# Patient Record
Sex: Male | Born: 1950 | Race: Black or African American | Hispanic: No | Smoking: Current every day smoker
Health system: Southern US, Community
[De-identification: ages and names within clinical notes are randomized; demographics above are authoritative.]

## PROBLEM LIST (undated history)

## (undated) DIAGNOSIS — M549 Dorsalgia, unspecified: Secondary | ICD-10-CM

## (undated) DIAGNOSIS — G8929 Other chronic pain: Secondary | ICD-10-CM

## (undated) HISTORY — PX: BACK SURGERY: SHX140

---

## 2005-11-29 ENCOUNTER — Emergency Department: Payer: Self-pay | Admitting: Emergency Medicine

## 2005-12-16 ENCOUNTER — Ambulatory Visit: Payer: Self-pay | Admitting: Specialist

## 2006-05-05 ENCOUNTER — Ambulatory Visit (HOSPITAL_COMMUNITY): Admission: RE | Admit: 2006-05-05 | Discharge: 2006-05-07 | Payer: Self-pay | Admitting: Neurosurgery

## 2006-09-22 ENCOUNTER — Ambulatory Visit: Payer: Self-pay | Admitting: Neurosurgery

## 2006-10-01 ENCOUNTER — Emergency Department: Payer: Self-pay | Admitting: Emergency Medicine

## 2009-08-26 ENCOUNTER — Ambulatory Visit: Payer: Self-pay | Admitting: Neurology

## 2013-08-15 ENCOUNTER — Emergency Department: Payer: Self-pay | Admitting: Emergency Medicine

## 2013-08-15 LAB — CBC
HCT: 48.6 % (ref 40.0–52.0)
HGB: 15.7 g/dL (ref 13.0–18.0)
MCH: 30.1 pg (ref 26.0–34.0)
MCHC: 32.4 g/dL (ref 32.0–36.0)
MCV: 93 fL (ref 80–100)
PLATELETS: 262 10*3/uL (ref 150–440)
RBC: 5.22 10*6/uL (ref 4.40–5.90)
RDW: 13.7 % (ref 11.5–14.5)
WBC: 11.4 10*3/uL — ABNORMAL HIGH (ref 3.8–10.6)

## 2013-08-15 LAB — DRUG SCREEN, URINE
AMPHETAMINES, UR SCREEN: NEGATIVE (ref ?–1000)
BARBITURATES, UR SCREEN: NEGATIVE (ref ?–200)
Benzodiazepine, Ur Scrn: NEGATIVE (ref ?–200)
COCAINE METABOLITE, UR ~~LOC~~: POSITIVE (ref ?–300)
Cannabinoid 50 Ng, Ur ~~LOC~~: NEGATIVE (ref ?–50)
MDMA (Ecstasy)Ur Screen: NEGATIVE (ref ?–500)
Methadone, Ur Screen: NEGATIVE (ref ?–300)
OPIATE, UR SCREEN: POSITIVE (ref ?–300)
Phencyclidine (PCP) Ur S: NEGATIVE (ref ?–25)
Tricyclic, Ur Screen: NEGATIVE (ref ?–1000)

## 2013-08-15 LAB — COMPREHENSIVE METABOLIC PANEL
AST: 21 U/L (ref 15–37)
Albumin: 3.6 g/dL (ref 3.4–5.0)
Alkaline Phosphatase: 85 U/L
Anion Gap: 8 (ref 7–16)
BUN: 9 mg/dL (ref 7–18)
Bilirubin,Total: 0.9 mg/dL (ref 0.2–1.0)
CALCIUM: 9.4 mg/dL (ref 8.5–10.1)
CREATININE: 0.94 mg/dL (ref 0.60–1.30)
Chloride: 106 mmol/L (ref 98–107)
Co2: 25 mmol/L (ref 21–32)
Glucose: 81 mg/dL (ref 65–99)
Osmolality: 275 (ref 275–301)
POTASSIUM: 3.6 mmol/L (ref 3.5–5.1)
SGPT (ALT): 17 U/L (ref 12–78)
Sodium: 139 mmol/L (ref 136–145)
TOTAL PROTEIN: 8.6 g/dL — AB (ref 6.4–8.2)

## 2013-08-15 LAB — URINALYSIS, COMPLETE
Bacteria: NONE SEEN
Blood: NEGATIVE
Glucose,UR: NEGATIVE mg/dL (ref 0–75)
KETONE: NEGATIVE
Nitrite: NEGATIVE
PH: 5 (ref 4.5–8.0)
Protein: 30
RBC,UR: 3 /HPF (ref 0–5)
SPECIFIC GRAVITY: 1.029 (ref 1.003–1.030)

## 2013-08-15 LAB — ETHANOL: Ethanol %: <0.003 % (ref 0.000–0.080)

## 2013-08-15 LAB — SALICYLATE LEVEL: SALICYLATES, SERUM: 3.7 mg/dL — AB

## 2013-08-15 LAB — ACETAMINOPHEN LEVEL: Acetaminophen: <2 ug/mL

## 2013-10-22 ENCOUNTER — Emergency Department: Payer: Self-pay | Admitting: Emergency Medicine

## 2013-10-22 LAB — BASIC METABOLIC PANEL
Anion Gap: 5 — ABNORMAL LOW (ref 7–16)
BUN: 9 mg/dL (ref 7–18)
CHLORIDE: 107 mmol/L (ref 98–107)
CREATININE: 1 mg/dL (ref 0.60–1.30)
Calcium, Total: 8.8 mg/dL (ref 8.5–10.1)
Co2: 27 mmol/L (ref 21–32)
EGFR (African American): >60
Glucose: 119 mg/dL — ABNORMAL HIGH (ref 65–99)
Osmolality: 277 (ref 275–301)
POTASSIUM: 3.4 mmol/L — AB (ref 3.5–5.1)
Sodium: 139 mmol/L (ref 136–145)

## 2013-10-22 LAB — CBC
HCT: 47.4 % (ref 40.0–52.0)
HGB: 15.4 g/dL (ref 13.0–18.0)
MCH: 30.2 pg (ref 26.0–34.0)
MCHC: 32.5 g/dL (ref 32.0–36.0)
MCV: 93 fL (ref 80–100)
PLATELETS: 268 10*3/uL (ref 150–440)
RBC: 5.11 10*6/uL (ref 4.40–5.90)
RDW: 13 % (ref 11.5–14.5)
WBC: 13.2 10*3/uL — AB (ref 3.8–10.6)

## 2013-10-22 LAB — D-DIMER(ARMC): D-Dimer: 551 ng/ml

## 2013-10-22 LAB — TROPONIN I

## 2014-08-04 IMAGING — CT CT ANGIO CHEST
2 of 6 series · 18 of 36 positions shown · IV contrast (APPLIED)
Comparison: None.

CLINICAL DATA: Left chest pain and scapular pain while eating a
cold hamburger. Smoker. Shortness of breath. Elevated D-dimer.

EXAM:
CT ANGIOGRAPHY CHEST WITH CONTRAST
TECHNIQUE: Multidetector CT imaging of the chest was performed using the
standard protocol during bolus administration of intravenous
contrast. Multiplanar CT image reconstructions and MIPs were
obtained to evaluate the vascular anatomy.
CONTRAST:  75 mL Isovue 370

[Series 5: pe 1.0 thins · axial · 0.68mm/px · z∈[-343,-91]mm · 17 of 284 slices shown]
[im 16/284  lung]
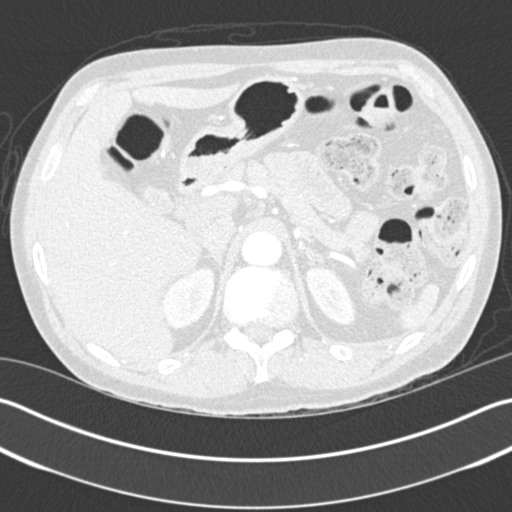
[im 32/284  mediastinal]
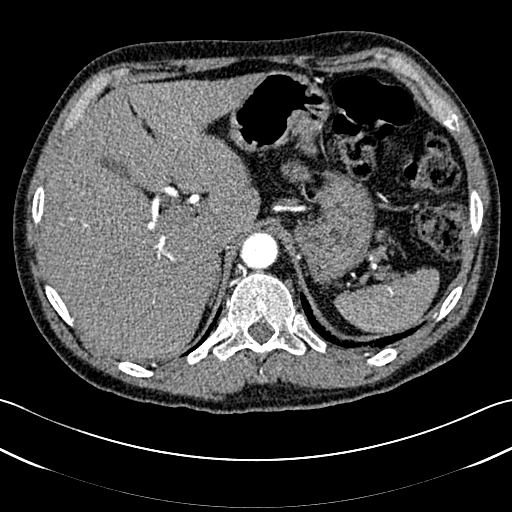
[im 48/284  lung]
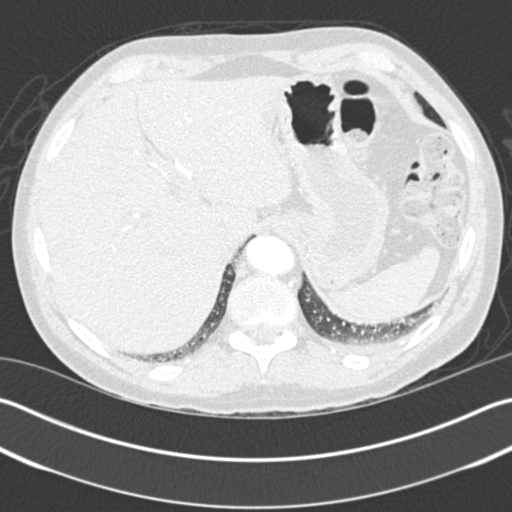
[im 63/284  mediastinal]
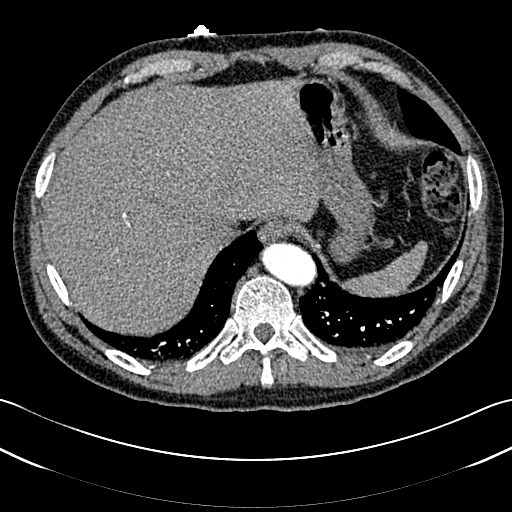
[im 79/284  lung]
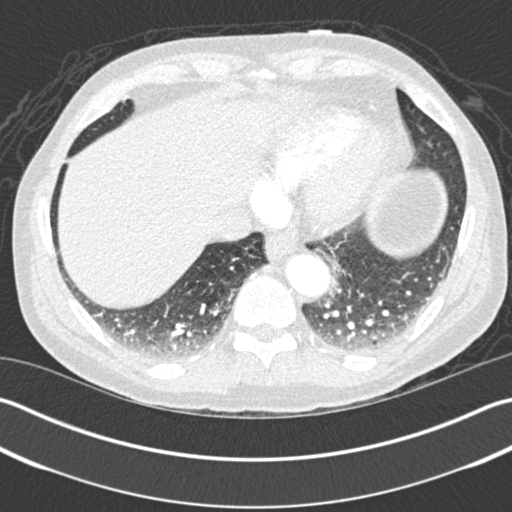
[im 95/284  mediastinal]
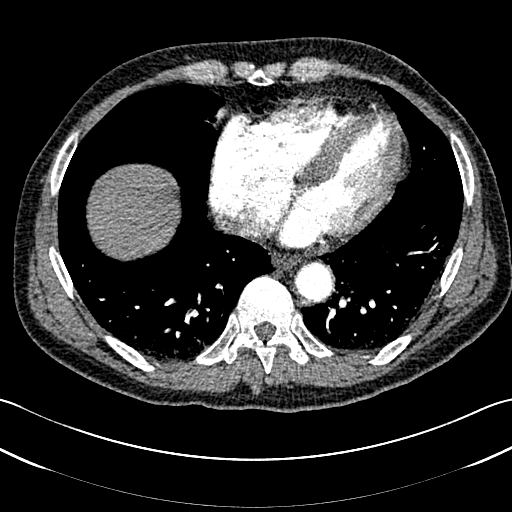
[im 111/284  lung]
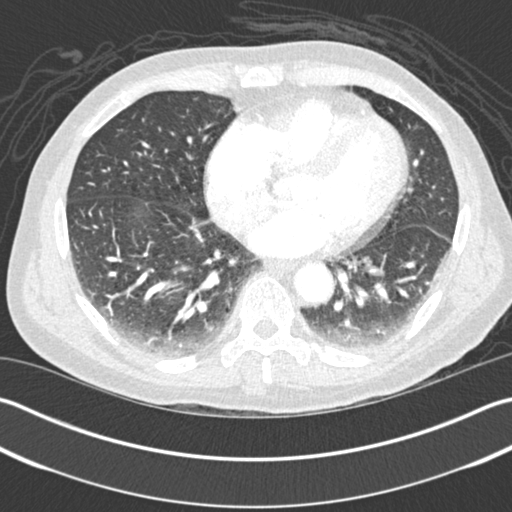
[im 126/284  mediastinal]
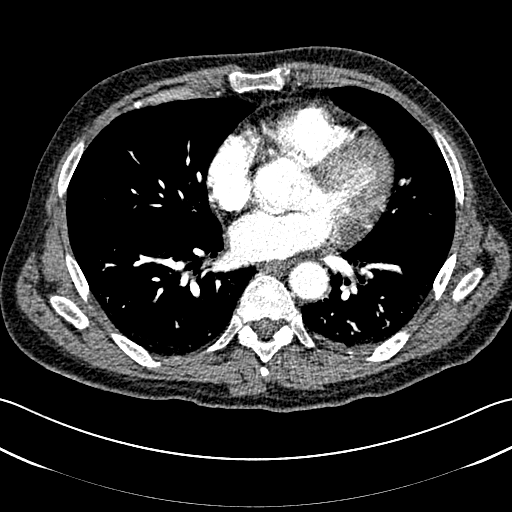
[im 142/284  lung]
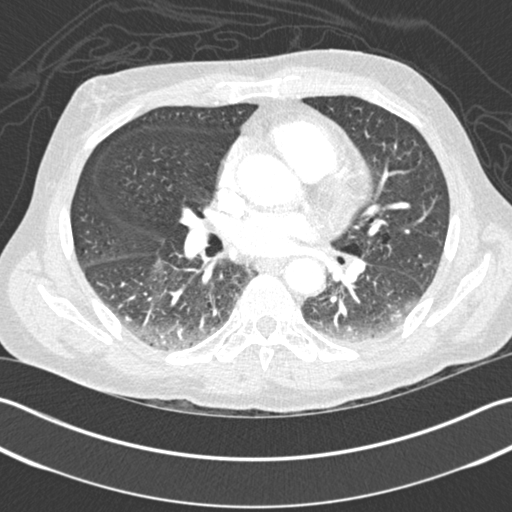
[im 158/284  mediastinal]
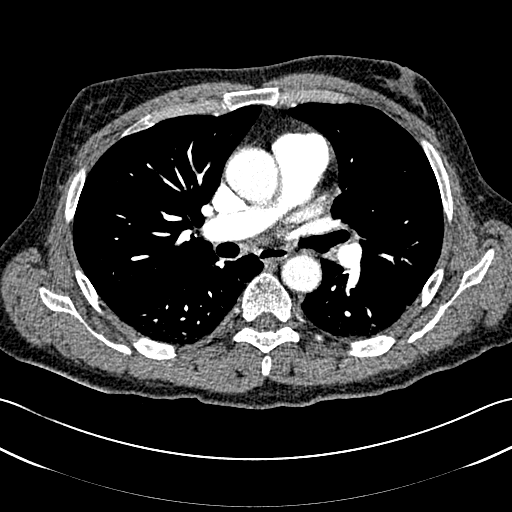
[im 173/284  lung]
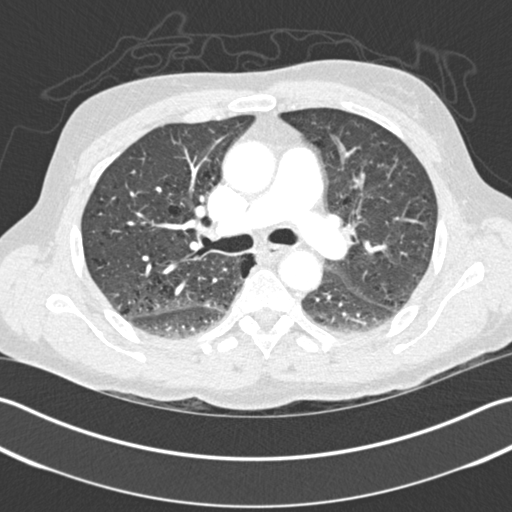
[im 189/284  mediastinal]
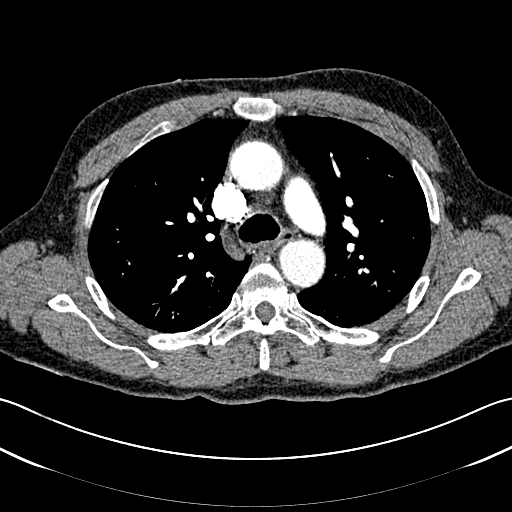
[im 205/284  lung]
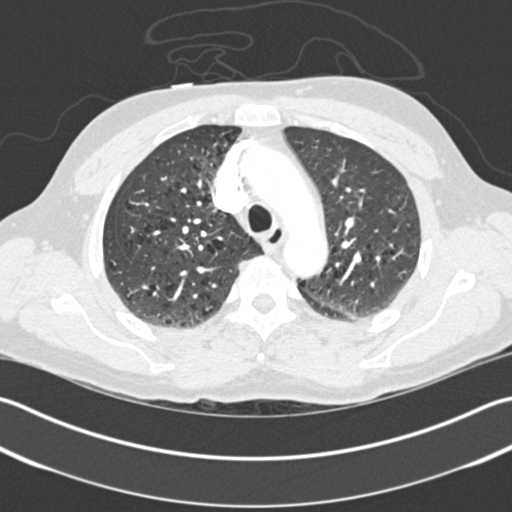
[im 221/284  mediastinal]
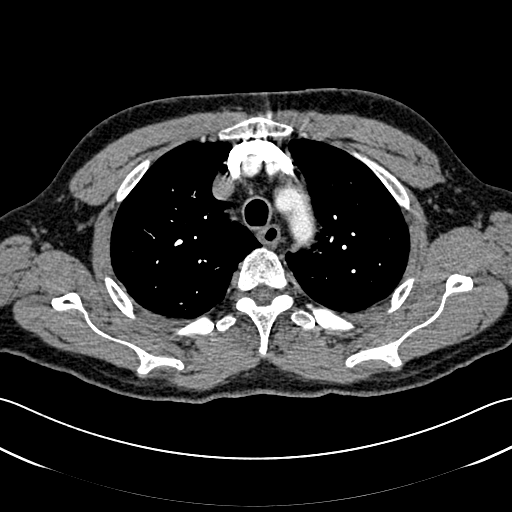
[im 236/284  lung]
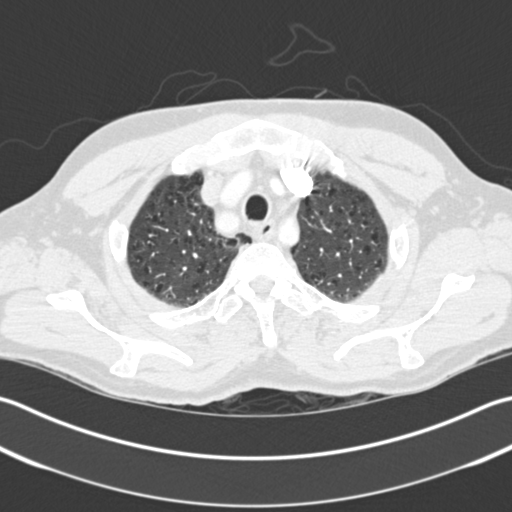
[im 252/284  mediastinal]
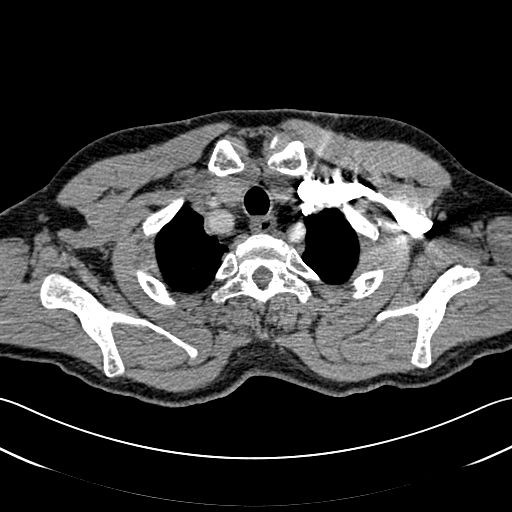
[im 268/284  lung]
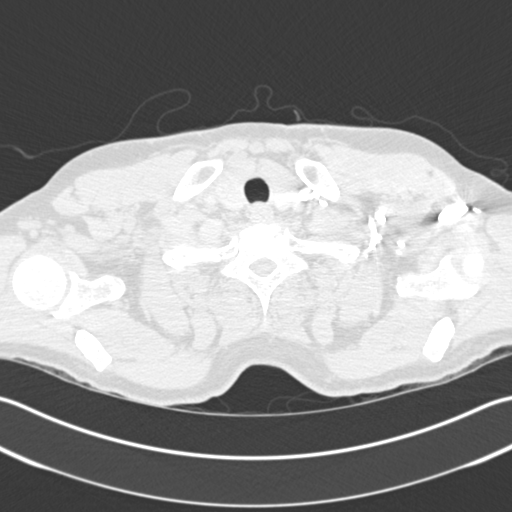

[Series 7: cor pe 2.0 mpr · coronal · 0.57mm/px · 1 of 120 slices shown]
[im 60/120  mediastinal]
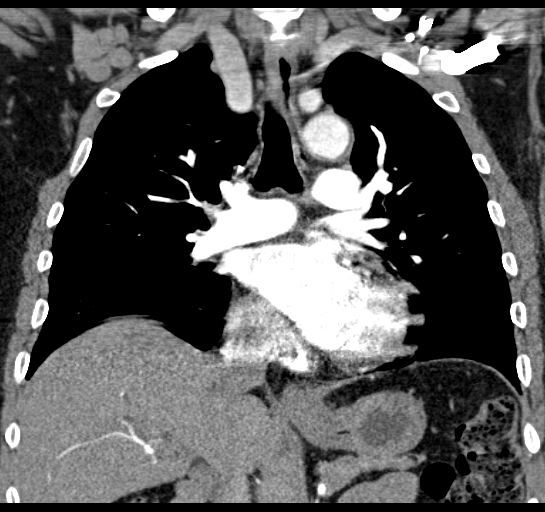

[18 of 36 positions shown; findings below may reference images not displayed]

FINDINGS: Technically adequate study with good opacification of the central
and segmental pulmonary arteries. No focal filling defects are
identified. No evidence of significant pulmonary embolus.

Normal heart size. No pericardial effusion. Coronary artery
calcifications. Normal caliber thoracic aorta without aneurysm. No
evidence of dissection. Great vessel origins are patent. Esophagus
is decompressed. No significant lymphadenopathy in the chest. No
pleural effusions. Visualized portions of the upper abdominal organs
are grossly unremarkable. Lungs demonstrate dependent changes in the
bases. Motion artifact limits evaluation of the lungs. No focal
airspace consolidation is identified. Diffuse centrilobular
emphysema throughout. No pneumothorax. Airways appear patent.
Minimal mucous demonstrated in the right mainstem bronchus. No
destructive bone lesions appreciated.

Review of the MIP images confirms the above findings.
IMPRESSION: No evidence of significant pulmonary embolus.

## 2014-08-04 IMAGING — CR DG CHEST 2V
1 series · 2 of 2 positions shown · non-contrast
Comparison: None.

CLINICAL DATA: Chest pain and weakness.

EXAM:
CHEST  2 VIEW

[Series 2: w chest pa · 0.14mm/px · 2 of 2 slices shown]
[im 1/2]
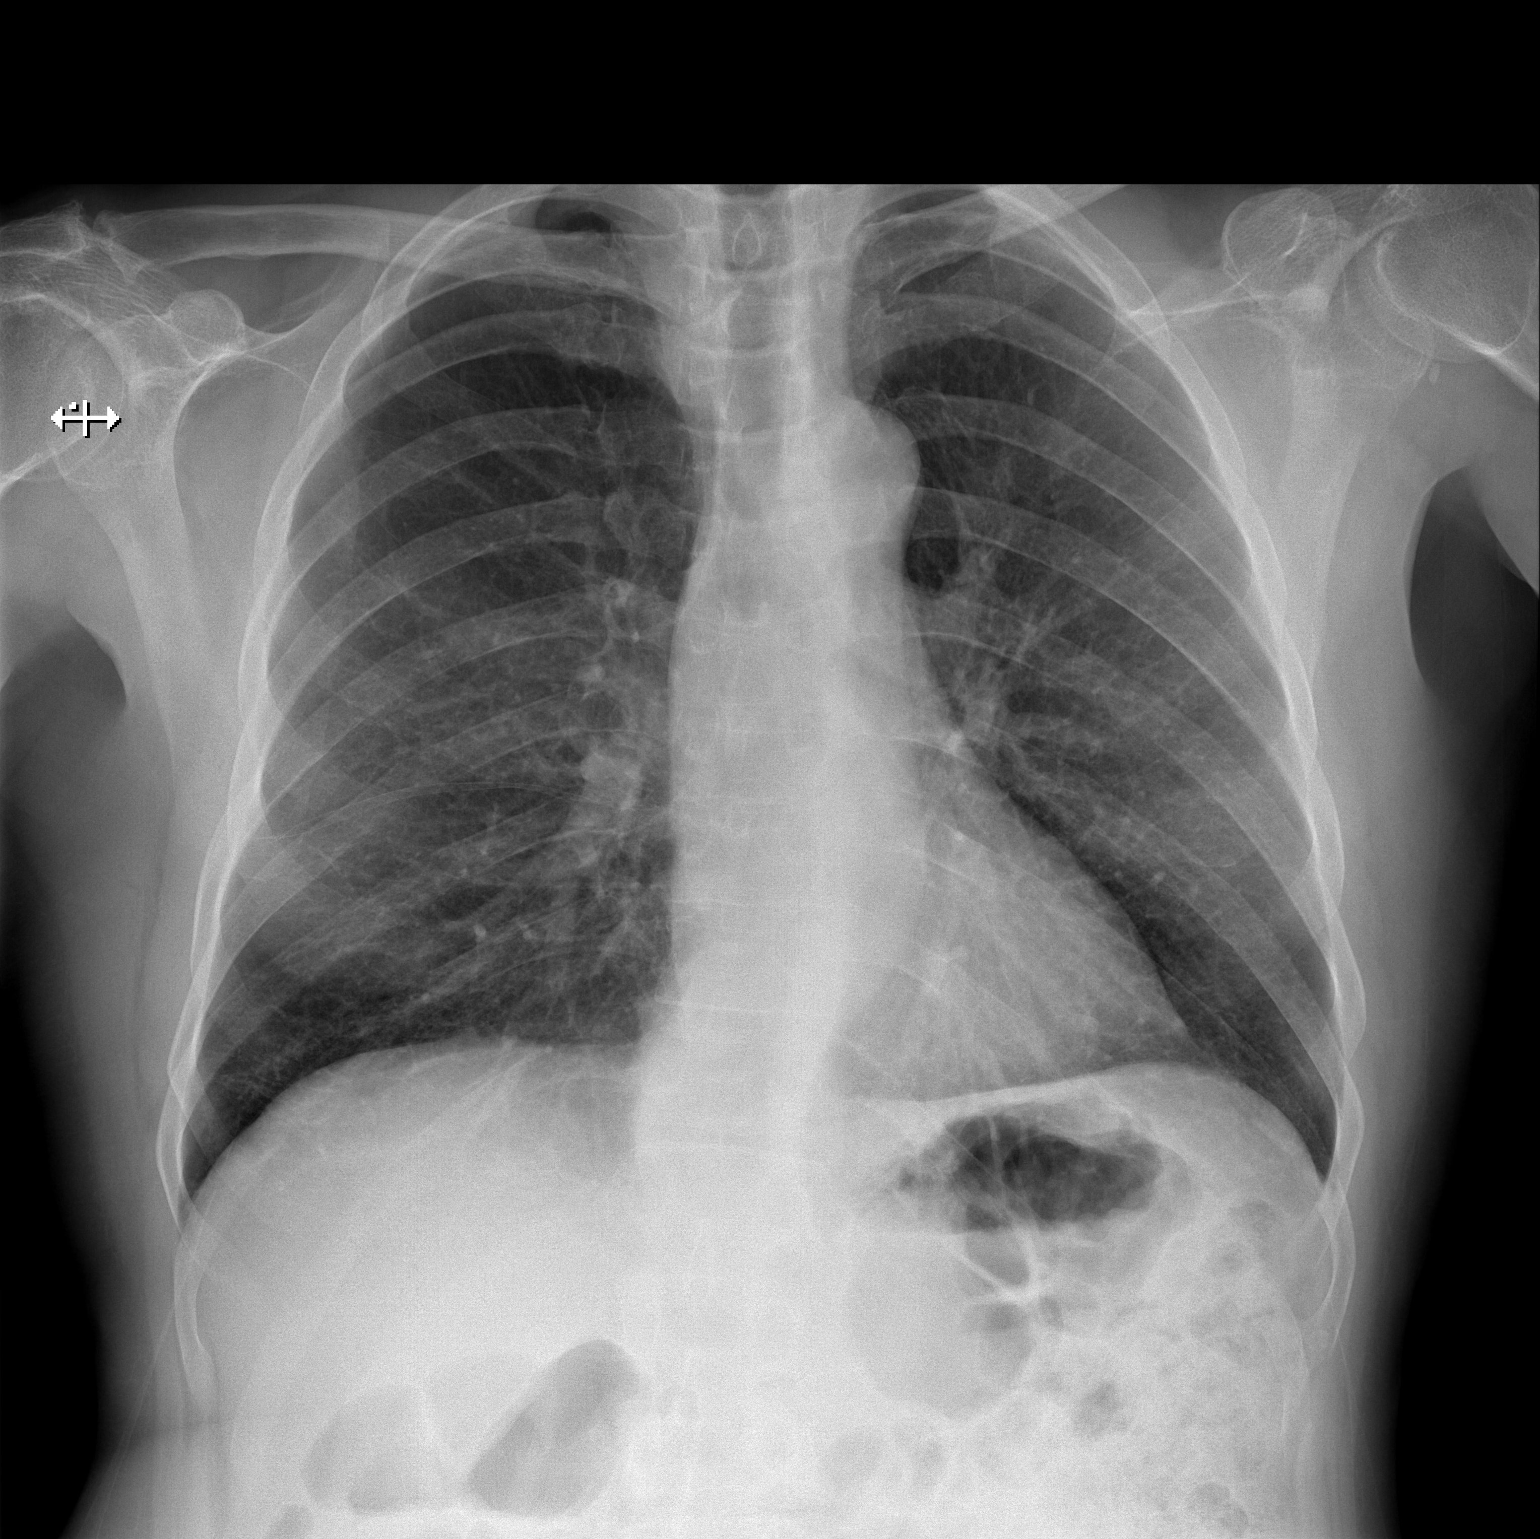
[im 2/2]
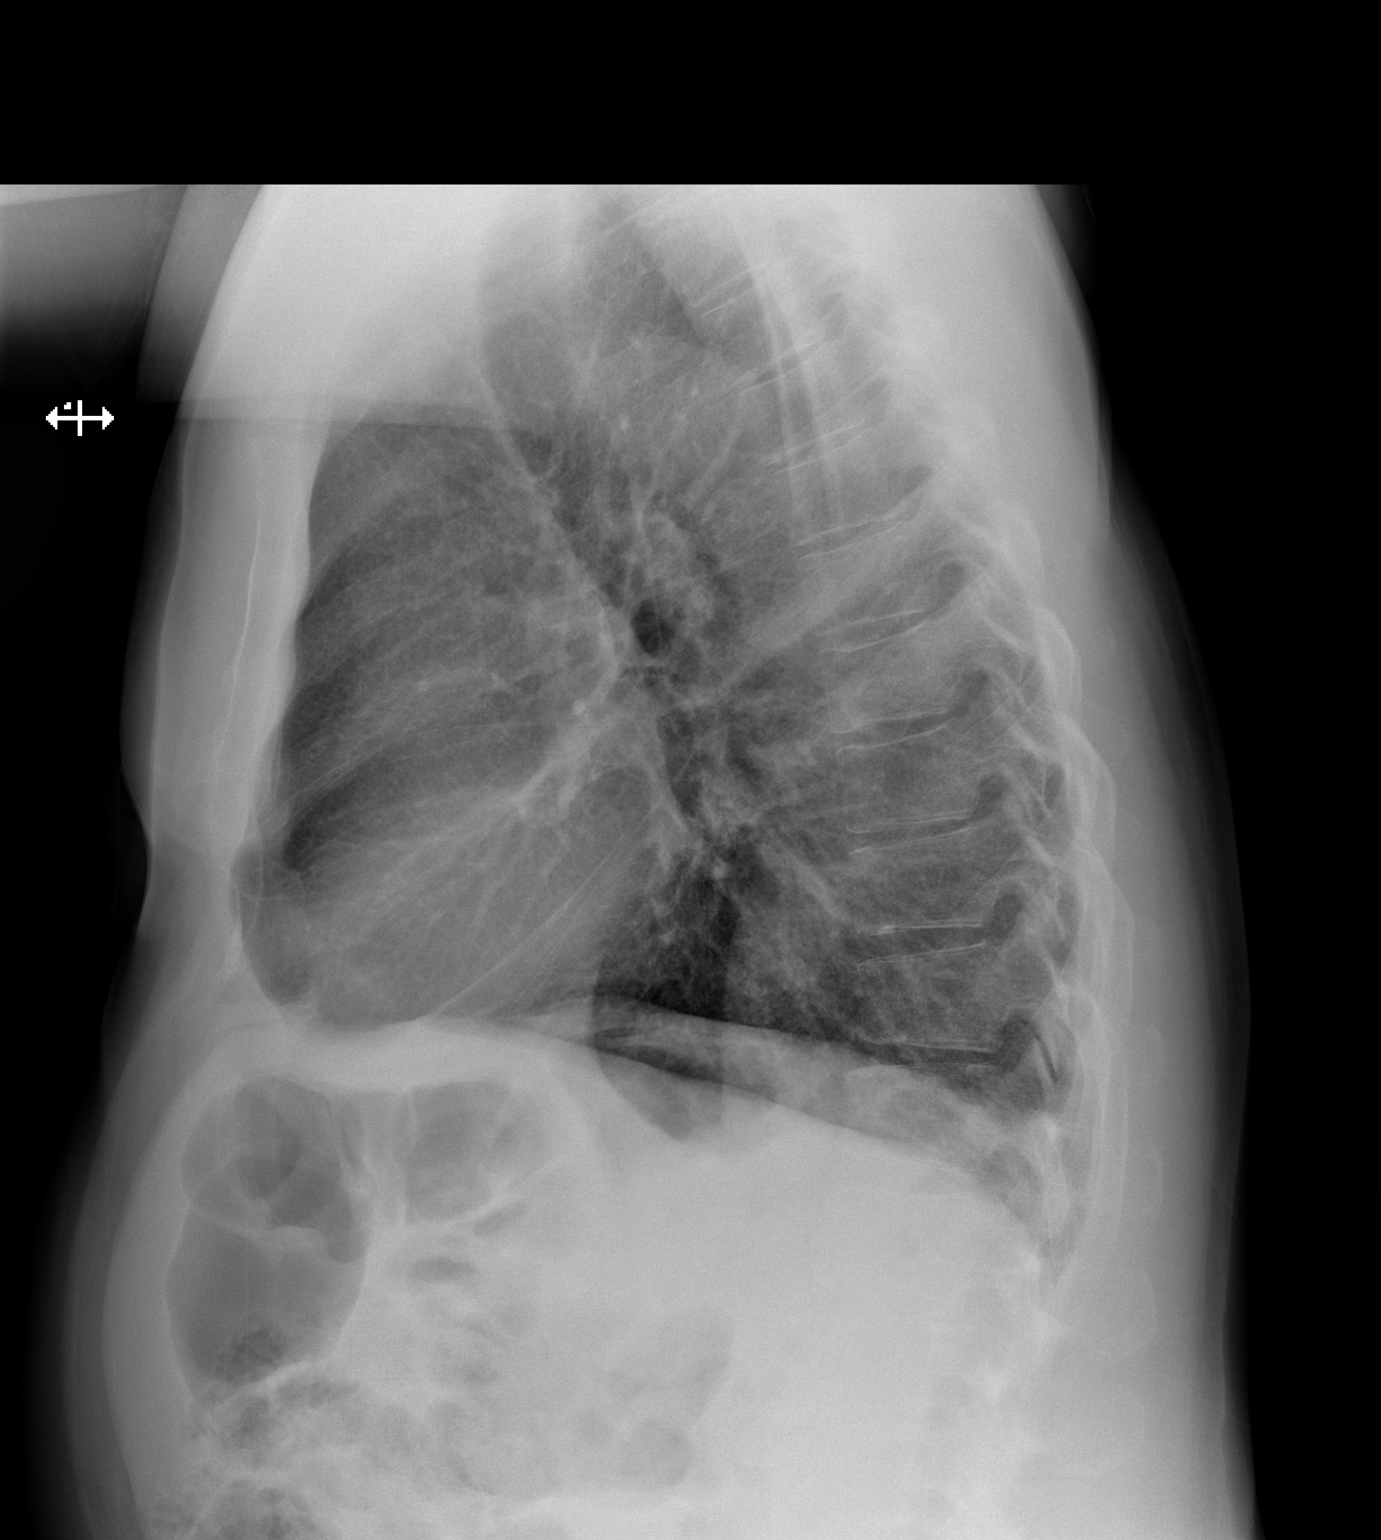

[2 of 2 positions shown; findings below may reference images not displayed]

FINDINGS: The cardiac silhouette, mediastinal and hilar contours are within
normal limits. There is mild tortuosity of the thoracic aorta. The
lungs are clear. No pleural effusion. The bony thorax is intact.
Slight anterior wedging of T11 and T12 of uncertain age.
IMPRESSION: No acute cardiopulmonary findings.

## 2014-11-15 ENCOUNTER — Emergency Department
Admission: EM | Admit: 2014-11-15 | Discharge: 2014-11-15 | Disposition: A | Discharge status: Home / Self Care | Payer: Medicare HMO | Attending: Emergency Medicine | Admitting: Emergency Medicine

## 2014-11-15 DIAGNOSIS — M549 Dorsalgia, unspecified: Secondary | ICD-10-CM | POA: Insufficient documentation

## 2014-11-15 DIAGNOSIS — Z720 Tobacco use: Secondary | ICD-10-CM | POA: Insufficient documentation

## 2014-11-15 DIAGNOSIS — G8929 Other chronic pain: Secondary | ICD-10-CM | POA: Insufficient documentation

## 2014-11-15 HISTORY — DX: Dorsalgia, unspecified: M54.9

## 2014-11-15 MED ORDER — KETOROLAC TROMETHAMINE 10 MG PO TABS: 10.0000 mg | ORAL_TABLET | Freq: Four times a day (QID) | ORAL | Status: DC | PRN

## 2014-11-15 MED ORDER — DIAZEPAM 5 MG PO TABS
5.0000 mg | ORAL_TABLET | Freq: Once | ORAL | Status: AC
  Administered 2014-11-15: 5 mg via ORAL
  Filled 2014-11-15: qty 1

## 2014-11-15 MED ORDER — DIAZEPAM 2 MG PO TABS: 2.0000 mg | ORAL_TABLET | Freq: Three times a day (TID) | ORAL | Status: DC | PRN

## 2014-11-15 MED ORDER — KETOROLAC TROMETHAMINE 60 MG/2ML IM SOLN
60.0000 mg | Freq: Once | INTRAMUSCULAR | Status: AC
  Administered 2014-11-15: 60 mg via INTRAMUSCULAR
  Filled 2014-11-15: qty 2

## 2014-11-15 NOTE — ED Notes (Signed)
Pt states he has hx of chronic back pain; pt ran out of fentanyl which he states he was taking at home. Back pain increasingly worse. Pt ambulatory with a cane.

## 2014-11-15 NOTE — Discharge Instructions (Signed)
Emergency care providers appreciate that many patients coming to us are in severe pain and we wish to address their pain in the safest, most responsible manner.  It is important to recognize, however, that the proper treatment of chronic pain differs from that of the pain of injuries and acute illnesses.  Our goal is to provider quality, safe, personalized care and we thank you for giving us the opportunity to serve you.  The use of narcotics and related agents for chronic pain syndromes may lead to additional physical and psychological problems.  Nearly as many people die from prescription narcotics each year as die from car crashes.  Additionally, this risk is increased if such prescriptions are obtained from a variety of sources.  Therefore, only your primary care physician or a pain management specialist is able to safely treat such syndromes with narcotic medications long-term.  Documentation revealing such prescriptions have been sought from multiple sources may prohibit us from providing a refill or different narcotic medication.  Your name may be checked first through the [place] Controlled Substances Reporting System.  This database is a record of controlled substance medication prescriptions that the patient has received.  This has been established by [employer] in an effort to eliminate the dangerous, and often life-threatening, practice of obtaining multiple prescriptions from different medical providers.  If you have a chronic pain syndrome (i.e. chronic headaches, recurrent back or neck pain, dental pain, abdominal or pelvic pain without a specific diagnosis, or neuropathic pain such as fibromyalgia) or recurrent visits for the same condition without an acute diagnosis, you may be treated with non-narcotics and other non-addictive medicines.  Allergic reactions or negative side effects that may be reported by a patient to such medications will not typically lead to the use of a  narcotic analgesic or other controlled substance as an alternative.  Patients managing chronic pain with a personal physician should have provisions in place for breakthrough pain.  If you are in crisis, you should call your physician.  If your physician directs you to the emergency department, please have the doctor call and speak to our attending physician concerning your care.  When patients come to the Emergency Department (ED) with acute medical conditions in which the ED physician feels it is appropriate to prescribe narcotic or sedating pain medication, the physician will prescribe these in very limited quantities.  The amount of these medications will last only until you can see your primary care physician in his/her office.  Any patient who returns to the ED seeking refills should expect only non-narcotic pain medications.  In the event an acute medical condition exists and the emergency physician feels it is necessary that the patient be given a narcotic or sedating medication, a responsible adult driver should be present in the room prior to the medication being given by the nurse.  Prescriptions for narcotic or sedating medications that have been lost, stolen, or expired will NOT be refilled in the ED.  Patients who have chronic pain may receive non-narcotic prescriptions until seen by their primary care physician.  It is every patient's personal responsibility to maintain active prescriptions with his or her primary care physician or specialist.  

## 2014-11-15 NOTE — ED Notes (Signed)
AAOx3. Skin warm and dry.  ambulting with steady gait.  Posture is forward, but shoulders are relaxed.  Moving all extremities equally and strong.  Patient states that he is feeling better and that the "edge is off" of the pain.

## 2014-11-15 NOTE — ED Provider Notes (Signed)
[facility] Emergency Department Provider Note ____________________________________________  Time seen: Approximately 4:03 PM  I have reviewed the triage vital signs and the nursing notes.   HISTORY  Chief Complaint Back Pain   HPI Jason Moss is a 64 y.o. male who presents to the emergency department for pain management. He has been out of Fentanyl for 2-3 months. He had been seeing "someone at [facility], but she left and my appointments got all messed up." He has not called to try and reschedule. He has not been taking anything for pain. He denies new injury. Pain is in the same location as previous. He denies loss of bowel or bladder control.   Past Medical History  Diagnosis Date  . Back pain     There are no active problems to display for this patient.   History reviewed. No pertinent past surgical history.  Current Outpatient Rx  Name  Route  Sig  Dispense  Refill  . diazepam (VALIUM) 2 MG tablet   Oral   Take 1 tablet (2 mg total) by mouth every 8 (eight) hours as needed for anxiety.   30 tablet   0   . ketorolac (TORADOL) 10 MG tablet   Oral   Take 1 tablet (10 mg total) by mouth every 6 (six) hours as needed.   20 tablet   0     Allergies Shellfish allergy  No family history on file.  Social History History  Substance Use Topics  . Smoking status: Current Every Day Smoker  . Smokeless tobacco: Not on file  . Alcohol Use: No    Review of Systems Constitutional: No recent illness. Eyes: No visual changes. ENT: No sore throat. Cardiovascular: Denies chest pain or palpitations. Respiratory: Denies shortness of breath. Gastrointestinal: No abdominal pain.  Genitourinary: Negative for dysuria. Musculoskeletal: Pain in lower back. Skin: Negative for rash. Neurological: Negative for headaches, focal weakness or numbness. 10-point ROS otherwise negative.  ____________________________________________   PHYSICAL EXAM:  VITAL  SIGNS: ED Triage Vitals  Enc Vitals Group     BP 11/15/14 1516 114/66 mmHg     Pulse Rate 11/15/14 1516 68     Resp 11/15/14 1516 18     Temp 11/15/14 1516 98.5 F (36.9 C)     Temp Source 11/15/14 1516 Oral     SpO2 11/15/14 1516 96 %     Weight 11/15/14 1516 160 lb (72.576 kg)     Height 11/15/14 1516 5\' 6"  (1.676 m)     Head Cir --      Peak Flow --      Pain Score 11/15/14 1516 9     Pain Loc --      Pain Edu? --      Excl. in GC? --     Constitutional: Alert and oriented. Well appearing and in no acute distress. Eyes: Conjunctivae are normal. EOMI. Head: Atraumatic. Nose: No congestion/rhinnorhea. Neck: No stridor.  Respiratory: Normal respiratory effort.   Musculoskeletal: Diffuse tenderness over the lumbar area without focal midline tenderness.  Neurologic:  Normal speech and language. No gross focal neurologic deficits are appreciated. Speech is normal. No gait instability. Skin:  Skin is warm, dry and intact. Atraumatic. Psychiatric: Mood and affect are normal. Speech and behavior are normal.  ____________________________________________   LABS (all labs ordered are listed, but only abnormal results are displayed)  Labs Reviewed - No data to display ____________________________________________  RADIOLOGY  Not indicated. ____________________________________________   PROCEDURES  Procedure(s)  performed: None   ____________________________________________   INITIAL IMPRESSION / ASSESSMENT AND PLAN / ED COURSE  Pertinent labs & imaging results that were available during my care of the patient were reviewed by me and considered in my medical decision making (see chart for details).  IM Toradol and po valium given.   Patient to follow up to call [facility] to reschedule appointment. He was advised to return to the ER for numbness, tingling, loss of bowel or bladder control, or other new symptoms.  He was advised that the ER does not manage chronic  pain. ____________________________________________   FINAL CLINICAL IMPRESSION(S) / ED DIAGNOSES  Final diagnoses:  Chronic back pain greater than 3 months duration      Chinita Pester, FNP 11/15/14 1807  Minna Antis, MD 11/16/14 1245

## 2014-12-04 ENCOUNTER — Encounter: Payer: Self-pay | Admitting: Emergency Medicine

## 2014-12-04 ENCOUNTER — Emergency Department
Admission: EM | Admit: 2014-12-04 | Discharge: 2014-12-04 | Disposition: A | Discharge status: Home / Self Care | Payer: Medicare HMO | Attending: Emergency Medicine | Admitting: Emergency Medicine

## 2014-12-04 DIAGNOSIS — M545 Low back pain, unspecified: Secondary | ICD-10-CM

## 2014-12-04 DIAGNOSIS — Z720 Tobacco use: Secondary | ICD-10-CM | POA: Insufficient documentation

## 2014-12-04 MED ORDER — ETODOLAC ER 400 MG PO TB24: 400.0000 mg | ORAL_TABLET | Freq: Every day | ORAL | Status: DC

## 2014-12-04 MED ORDER — CYCLOBENZAPRINE HCL 10 MG PO TABS: 10.0000 mg | ORAL_TABLET | Freq: Three times a day (TID) | ORAL | Status: DC | PRN

## 2014-12-04 MED ORDER — KETOROLAC TROMETHAMINE 60 MG/2ML IM SOLN
60.0000 mg | Freq: Once | INTRAMUSCULAR | Status: AC
  Administered 2014-12-04: 60 mg via INTRAMUSCULAR
  Filled 2014-12-04: qty 2

## 2014-12-04 NOTE — ED Notes (Signed)
Back pain

## 2014-12-04 NOTE — ED Notes (Signed)
Low back pain.  Says he has had for long time, but it is worsel

## 2014-12-04 NOTE — Discharge Instructions (Signed)
Musculoskeletal Pain Musculoskeletal pain is muscle and boney aches and pains. These pains can occur in any part of the body. Your caregiver may treat you without knowing the cause of the pain. They may treat you if blood or urine tests, X-rays, and other tests were normal.  CAUSES There is often not a definite cause or reason for these pains. These pains may be caused by a type of germ (virus). The discomfort may also come from overuse. Overuse includes working out too hard when your body is not fit. Boney aches also come from weather changes. Bone is sensitive to atmospheric pressure changes. HOME CARE INSTRUCTIONS   Ask when your test results will be ready. Make sure you get your test results.  Only take over-the-counter or prescription medicines for pain, discomfort, or fever as directed by your caregiver. If you were given medications for your condition, do not drive, operate machinery or power tools, or sign legal documents for 24 hours. Do not drink alcohol. Do not take sleeping pills or other medications that may interfere with treatment.  Continue all activities unless the activities cause more pain. When the pain lessens, slowly resume normal activities. Gradually increase the intensity and duration of the activities or exercise.  During periods of severe pain, bed rest may be helpful. Lay or sit in any position that is comfortable.  Putting ice on the injured area.  Put ice in a bag.  Place a towel between your skin and the bag.  Leave the ice on for 15 to 20 minutes, 3 to 4 times a day.  Follow up with your caregiver for continued problems and no reason can be found for the pain. If the pain becomes worse or does not go away, it may be necessary to repeat tests or do additional testing. Your caregiver may need to look further for a possible cause. SEEK IMMEDIATE MEDICAL CARE IF:  You have pain that is getting worse and is not relieved by medications.  You develop chest pain  that is associated with shortness or breath, sweating, feeling sick to your stomach (nauseous), or throw up (vomit).  Your pain becomes localized to the abdomen.  You develop any new symptoms that seem different or that concern you. MAKE SURE YOU:   Understand these instructions.  Will watch your condition.  Will get help right away if you are not doing well or get worse. Document Released: 04/06/2005 Document Revised: 06/29/2011 Document Reviewed: 12/09/2012 [facility] Patient Information 2015 [place], [place]. This information is not intended to replace advice given to you by your health care provider. Make sure you discuss any questions you have with your health care provider.  Back Pain, Adult Back pain is very common. The pain often gets better over time. The cause of back pain is usually not dangerous. Most people can learn to manage their back pain on their own.  HOME CARE   Stay active. Start with short walks on flat ground if you can. Try to walk farther each day.  Do not sit, drive, or stand in one place for more than 30 minutes. Do not stay in bed.  Do not avoid exercise or work. Activity can help your back heal faster.  Be careful when you bend or lift an object. Bend at your knees, keep the object close to you, and do not twist.  Sleep on a firm mattress. Lie on your side, and bend your knees. If you lie on your back, put a pillow under your  knees.  Only take medicines as told by your doctor.  Put ice on the injured area.  Put ice in a plastic bag.  Place a towel between your skin and the bag.  Leave the ice on for 15-20 minutes, 03-04 times a day for the first 2 to 3 days. After that, you can switch between ice and heat packs.  Ask your doctor about back exercises or massage.  Avoid feeling anxious or stressed. Find good ways to deal with stress, such as exercise. GET HELP RIGHT AWAY IF:   Your pain does not go away with rest or medicine.  Your pain does not  go away in 1 week.  You have new problems.  You do not feel well.  The pain spreads into your legs.  You cannot control when you poop (bowel movement) or pee (urinate).  Your arms or legs feel weak or lose feeling (numbness).  You feel sick to your stomach (nauseous) or throw up (vomit).  You have belly (abdominal) pain.  You feel like you may pass out (faint). MAKE SURE YOU:   Understand these instructions.  Will watch your condition.  Will get help right away if you are not doing well or get worse. Document Released: 09/23/2007 Document Revised: 06/29/2011 Document Reviewed: 08/08/2013 [facility] Patient Information 2015 [place], [place]. This information is not intended to replace advice given to you by your health care provider. Make sure you discuss any questions you have with your health care provider.

## 2014-12-04 NOTE — ED Provider Notes (Signed)
[facility] Emergency Department Provider Note  ____________________________________________  Time seen: Approximately 9:41 AM  I have reviewed the triage vital signs and the nursing notes.   HISTORY  Chief Complaint Back Pain    HPI Jason Moss is a 64 y.o. male Jason Moss is a 64 y.o. male who presents to the emergency department for pain management. He has been out of Fentanyl for 2-3 months. He had been seeing "someone at [facility], but she left and my appointments got all messed up." He has not called to try and reschedule. He has not been taking anything for pain. He denies new injury. Pain is in the same location as previous. He denies loss of bowel or bladder control.   Past Medical History  Diagnosis Date  . Back pain     There are no active problems to display for this patient.   Past Surgical History  Procedure Laterality Date  . Back surgery      Current Outpatient Rx  Name  Route  Sig  Dispense  Refill  . cyclobenzaprine (FLEXERIL) 10 MG tablet   Oral   Take 1 tablet (10 mg total) by mouth every 8 (eight) hours as needed for muscle spasms.   30 tablet   1   . diazepam (VALIUM) 2 MG tablet   Oral   Take 1 tablet (2 mg total) by mouth every 8 (eight) hours as needed for anxiety.   30 tablet   0   . etodolac (LODINE XL) 400 MG 24 hr tablet   Oral   Take 1 tablet (400 mg total) by mouth daily.   90 tablet   0   . ketorolac (TORADOL) 10 MG tablet   Oral   Take 1 tablet (10 mg total) by mouth every 6 (six) hours as needed.   20 tablet   0     Allergies Shellfish allergy  No family history on file.  Social History Social History  Substance Use Topics  . Smoking status: Current Every Day Smoker  . Smokeless tobacco: None  . Alcohol Use: No    Review of Systems Constitutional: No fever/chills Eyes: No visual changes. ENT: No sore throat. Cardiovascular: Denies chest pain. Respiratory: Denies shortness of  breath. Gastrointestinal: No abdominal pain.  No nausea, no vomiting.  No diarrhea.  No constipation. Genitourinary: Negative for dysuria. Musculoskeletal: Negative for back pain. Skin: Negative for rash. Neurological: Negative for headaches, focal weakness or numbness.  10-point ROS otherwise negative.  ____________________________________________   PHYSICAL EXAM:  VITAL SIGNS: ED Triage Vitals  Enc Vitals Group     BP 12/04/14 0829 148/84 mmHg     Pulse Rate 12/04/14 0829 68     Resp 12/04/14 0829 16     Temp 12/04/14 0829 98.3 F (36.8 C)     Temp Source 12/04/14 0829 Oral     SpO2 12/04/14 0829 95 %     Weight 12/04/14 0829 155 lb (70.308 kg)     Height 12/04/14 0829  (1.702 m)     Head Cir --      Peak Flow --      Pain Score 12/04/14 0843 9     Pain Loc --      Pain Edu? --      Excl. in GC? --     Constitutional: Alert and oriented. Well appearing and in no acute distress. Eyes: Conjunctivae are normal. PERRL. EOMI. Head: Atraumatic. Nose: No congestion/rhinnorhea. Mouth/Throat: Mucous  membranes are moist.  Oropharynx non-erythematous. Neck: No stridor.   Cardiovascular: Normal rate, regular rhythm. Grossly normal heart sounds.  Good peripheral circulation. Respiratory: Normal respiratory effort.  No retractions. Lungs CTAB. Musculoskeletal: No lower extremity tenderness nor edema.  No joint effusions. Neurologic:  Normal speech and language. No gross focal neurologic deficits are appreciated. No gait instability. Skin:  Skin is warm, dry and intact. No rash noted. Psychiatric: Mood and affect are normal. Speech and behavior are normal.  ____________________________________________   LABS (all labs ordered are listed, but only abnormal results are displayed)  Labs Reviewed - No data to display   PROCEDURES  Procedure(s) performed: None  Critical Care performed: No  ____________________________________________   INITIAL IMPRESSION /  ASSESSMENT AND PLAN / ED COURSE  Pertinent labs & imaging results that were available during my care of the patient were reviewed by me and considered in my medical decision making (see chart for details).  Patient states approval with Toradol injection will discuss discharge with Rx for Lodine 400 mg 3 times a day, Flexeril 10 mg 3 times a day and follow up with PCP or pain management as directed. ____________________________________________   FINAL CLINICAL IMPRESSION(S) / ED DIAGNOSES  Final diagnoses:  Back pain at L4-L5 level      Evangeline Dakin, PA-C 12/04/14 1142  Sharyn Creamer, MD 12/05/14 2235

## 2015-01-01 ENCOUNTER — Emergency Department
Admission: EM | Admit: 2015-01-01 | Discharge: 2015-01-01 | Disposition: A | Discharge status: Home / Self Care | Payer: Medicare HMO | Attending: Emergency Medicine | Admitting: Emergency Medicine

## 2015-01-01 ENCOUNTER — Encounter: Payer: Self-pay | Admitting: Emergency Medicine

## 2015-01-01 DIAGNOSIS — M545 Low back pain: Secondary | ICD-10-CM | POA: Diagnosis not present

## 2015-01-01 DIAGNOSIS — Z720 Tobacco use: Secondary | ICD-10-CM | POA: Insufficient documentation

## 2015-01-01 DIAGNOSIS — M5416 Radiculopathy, lumbar region: Secondary | ICD-10-CM

## 2015-01-01 DIAGNOSIS — Z79899 Other long term (current) drug therapy: Secondary | ICD-10-CM | POA: Insufficient documentation

## 2015-01-01 DIAGNOSIS — M549 Dorsalgia, unspecified: Secondary | ICD-10-CM

## 2015-01-01 DIAGNOSIS — G8929 Other chronic pain: Secondary | ICD-10-CM | POA: Diagnosis not present

## 2015-01-01 MED ORDER — ORPHENADRINE CITRATE ER 100 MG PO TB12: 100.0000 mg | ORAL_TABLET | Freq: Two times a day (BID) | ORAL | Status: DC

## 2015-01-01 MED ORDER — DEXAMETHASONE SODIUM PHOSPHATE 10 MG/ML IJ SOLN
10.0000 mg | Freq: Once | INTRAMUSCULAR | Status: AC
  Administered 2015-01-01: 10 mg via INTRAMUSCULAR
  Filled 2015-01-01: qty 1

## 2015-01-01 MED ORDER — METHYLPREDNISOLONE 4 MG PO TBPK: ORAL_TABLET | ORAL | Status: DC

## 2015-01-01 NOTE — ED Provider Notes (Signed)
[facility] Emergency Department Provider Note  ____________________________________________  Time seen: Approximately 7:21 AM  I have reviewed the triage vital signs and the nursing notes.   HISTORY  Chief Complaint Back Pain    HPI Jason Moss is a 64 y.o. male is complaining of radicular pain from his back to his left leg. Patient state onset 2-3 days ago. Patient denies any bladder or bowel dysfunction. Patient has a long-term history of chronic low back pain. Patient sees no longer followed by his pain management clinic secondary to doctors relocated. Patient rated his pain as a 10 over 10.Patient was seen approximately a month ago similar complaint. Patient has not follow-up as directed with pain management. No palliative measures for this complaint.   Past Medical History  Diagnosis Date  . Back pain     There are no active problems to display for this patient.   Past Surgical History  Procedure Laterality Date  . Back surgery      Current Outpatient Rx  Name  Route  Sig  Dispense  Refill  . cyclobenzaprine (FLEXERIL) 10 MG tablet   Oral   Take 1 tablet (10 mg total) by mouth every 8 (eight) hours as needed for muscle spasms.   30 tablet   1   . diazepam (VALIUM) 2 MG tablet   Oral   Take 1 tablet (2 mg total) by mouth every 8 (eight) hours as needed for anxiety.   30 tablet   0   . etodolac (LODINE XL) 400 MG 24 hr tablet   Oral   Take 1 tablet (400 mg total) by mouth daily.   90 tablet   0   . ketorolac (TORADOL) 10 MG tablet   Oral   Take 1 tablet (10 mg total) by mouth every 6 (six) hours as needed.   20 tablet   0     Allergies Shellfish allergy  No family history on file.  Social History Social History  Substance Use Topics  . Smoking status: Current Every Day Smoker  . Smokeless tobacco: None  . Alcohol Use: No    Review of Systems Constitutional: No fever/chills Eyes: No visual changes. ENT: No sore  throat. Cardiovascular: Denies chest pain. Respiratory: Denies shortness of breath. Gastrointestinal: No abdominal pain.  No nausea, no vomiting.  No diarrhea.  No constipation. Genitourinary: Negative for dysuria. Musculoskeletal chronic low back pain. Skin: Negative for rash. Neurological: Negative for headaches, focal weakness or numbness. 10-point ROS otherwise negative.  ____________________________________________   PHYSICAL EXAM:  VITAL SIGNS: ED Triage Vitals  Enc Vitals Group     BP 01/01/15 0320 126/78 mmHg     Pulse Rate 01/01/15 0320 85     Resp 01/01/15 0320 22     Temp 01/01/15 0320 97.9 F (36.6 C)     Temp Source 01/01/15 0320 Oral     SpO2 01/01/15 0320 97 %     Weight 01/01/15 0320 155 lb (70.308 kg)     Height 01/01/15 0320 5\' 6"  (1.676 m)     Head Cir --      Peak Flow --      Pain Score 01/01/15 0320 10     Pain Loc --      Pain Edu? --      Excl. in GC? --     Constitutional: Alert and oriented. Well appearing and in no acute distress. Eyes: Conjunctivae are normal. PERRL. EOMI. Head: Atraumatic. Nose: No congestion/rhinnorhea. Mouth/Throat:  Mucous membranes are moist.  Oropharynx non-erythematous. Neck: No stridor.   Cardiovascular: Normal rate, regular rhythm. Grossly normal heart sounds.  Good peripheral circulation. Respiratory: Normal respiratory effort.  No retractions. Lungs CTAB. Gastrointestinal: Soft and nontender. No distention. No abdominal bruits. No CVA tenderness. Musculoskeletal: No obvious deformity to the lumbar spine. Patient has some moderate guarding palpation of 3 through L5. Patient decreased range of motion. She stated limited by pain patient is a negative straight leg raise. Patient was able to ambulate with support a cane.  Neurologic:  Normal speech and language. No gross focal neurologic deficits are appreciated. No gait instability. Skin:  Skin is warm, dry and intact. No rash noted. Psychiatric: Mood and affect are  normal. Speech and behavior are normal.  ____________________________________________   LABS (all labs ordered are listed, but only abnormal results are displayed)  Labs Reviewed - No data to display ____________________________________________  EKG   ____________________________________________  RADIOLOGY   ____________________________________________   PROCEDURES  Procedure(s) performed: None  Critical Care performed: No  ____________________________________________   INITIAL IMPRESSION / ASSESSMENT AND PLAN / ED COURSE  Pertinent labs & imaging results that were available during my care of the patient were reviewed by me and considered in my medical decision making (see chart for details).  Chronic low back pain. Radicular pain to the left lower extremity. Patient will be given a injection of Decadron 10 mg IM and Norflex 60 mg IM. Patient will be discharged with Flexeril and prednisone. Patient advised to follow-up with pain management. ____________________________________________   FINAL CLINICAL IMPRESSION(S) / ED DIAGNOSES  Final diagnoses:  Chronic back pain greater than 3 months duration  Chronic radicular pain of lower back      Joni Reining, PA-C 01/01/15 1610  Arnaldo Natal, MD 01/01/15 [phone]

## 2015-01-01 NOTE — ED Notes (Signed)
Pt to triage via w/c with no distress; c/o lower back pain radiating down left leg "for long time"; st injured back "opening a door"

## 2015-04-17 ENCOUNTER — Encounter: Payer: Self-pay | Admitting: *Deleted

## 2015-04-17 ENCOUNTER — Emergency Department
Admission: EM | Admit: 2015-04-17 | Discharge: 2015-04-17 | Disposition: A | Discharge status: Home / Self Care | Payer: Medicare HMO | Attending: Emergency Medicine | Admitting: Emergency Medicine

## 2015-04-17 DIAGNOSIS — F17200 Nicotine dependence, unspecified, uncomplicated: Secondary | ICD-10-CM | POA: Insufficient documentation

## 2015-04-17 DIAGNOSIS — M5442 Lumbago with sciatica, left side: Secondary | ICD-10-CM | POA: Diagnosis not present

## 2015-04-17 DIAGNOSIS — Z79899 Other long term (current) drug therapy: Secondary | ICD-10-CM | POA: Diagnosis not present

## 2015-04-17 DIAGNOSIS — G8929 Other chronic pain: Secondary | ICD-10-CM | POA: Insufficient documentation

## 2015-04-17 DIAGNOSIS — M545 Low back pain: Secondary | ICD-10-CM | POA: Diagnosis present

## 2015-04-17 MED ORDER — KETOROLAC TROMETHAMINE 30 MG/ML IJ SOLN
60.0000 mg | Freq: Once | INTRAMUSCULAR | Status: AC
  Administered 2015-04-17: 60 mg via INTRAMUSCULAR
  Filled 2015-04-17: qty 2

## 2015-04-17 MED ORDER — MELOXICAM 15 MG PO TABS: 15.0000 mg | ORAL_TABLET | Freq: Every day | ORAL | Status: DC

## 2015-04-17 MED ORDER — DIAZEPAM 2 MG PO TABS: 2.0000 mg | ORAL_TABLET | Freq: Three times a day (TID) | ORAL | Status: DC | PRN

## 2015-04-17 NOTE — ED Notes (Signed)
Pt has chronic back pain, pt reports increased pain, pt reports he has ran out of pain medication

## 2015-04-17 NOTE — ED Provider Notes (Signed)
[facility] Emergency Department Provider Note  ____________________________________________  Time seen: Approximately 5:22 PM  I have reviewed the triage vital signs and the nursing notes.   HISTORY  Chief Complaint Back Pain    HPI Jason Moss is a 64 y.o. male who presents emergency department complaining of lower back pain. He states he has a chronic history of back pain and has had "back surgery" [facility] approximately a year ago. He states that he has been taking aspirin for this pain but it is no longer effective. He states the pain is worse on the left side. There is some radiation down his left leg. He denies any saddle anesthesia or bowel or bladder dysfunction. No no recent history of injury to area. Pain is constant, moderate to severe, described as a burning sensation.   Past Medical History  Diagnosis Date  . Back pain     There are no active problems to display for this patient.   Past Surgical History  Procedure Laterality Date  . Back surgery      Current Outpatient Rx  Name  Route  Sig  Dispense  Refill  . cyclobenzaprine (FLEXERIL) 10 MG tablet   Oral   Take 1 tablet (10 mg total) by mouth every 8 (eight) hours as needed for muscle spasms.   30 tablet   1   . diazepam (VALIUM) 2 MG tablet   Oral   Take 1 tablet (2 mg total) by mouth every 8 (eight) hours as needed for anxiety.   15 tablet   0   . etodolac (LODINE XL) 400 MG 24 hr tablet   Oral   Take 1 tablet (400 mg total) by mouth daily.   90 tablet   0   . ketorolac (TORADOL) 10 MG tablet   Oral   Take 1 tablet (10 mg total) by mouth every 6 (six) hours as needed.   20 tablet   0   . meloxicam (MOBIC) 15 MG tablet   Oral   Take 1 tablet (15 mg total) by mouth daily.   30 tablet   0   . methylPREDNISolone (MEDROL DOSEPAK) 4 MG TBPK tablet      Take Tapered dose as directed   21 tablet   0   . orphenadrine (NORFLEX) 100 MG tablet   Oral   Take 1  tablet (100 mg total) by mouth 2 (two) times daily.   10 tablet   0     Allergies Shellfish allergy  No family history on file.  Social History Social History  Substance Use Topics  . Smoking status: Current Every Day Smoker  . Smokeless tobacco: None  . Alcohol Use: No    Review of Systems Constitutional: No fever/chills Eyes: No visual changes. ENT: No sore throat. Cardiovascular: Denies chest pain. Respiratory: Denies shortness of breath. Gastrointestinal: No abdominal pain.  No nausea, no vomiting.  No diarrhea.  No constipation. Genitourinary: Negative for dysuria. Musculoskeletal: Endorses lower back pain. Skin: Negative for rash. Neurological: Negative for headaches, focal weakness or numbness.  10-point ROS otherwise negative.  ____________________________________________   PHYSICAL EXAM:  VITAL SIGNS: ED Triage Vitals  Enc Vitals Group     BP 04/17/15 1704 164/104 mmHg     Pulse Rate 04/17/15 1704 85     Resp 04/17/15 1704 20     Temp 04/17/15 1704 98.6 F (37 C)     Temp Source 04/17/15 1704 Oral  SpO2 04/17/15 1704 97 %     Weight 04/17/15 1704 170 lb (77.111 kg)     Height 04/17/15 1704  (1.676 m)     Head Cir --      Peak Flow --      Pain Score --      Pain Loc --      Pain Edu? --      Excl. in GC? --     Constitutional: Alert and oriented. Well appearing and in no acute distress. Eyes: Conjunctivae are normal. PERRL. EOMI. Head: Atraumatic. Nose: No congestion/rhinnorhea. Mouth/Throat: Mucous membranes are moist.  Oropharynx non-erythematous. Neck: No stridor.   Cardiovascular: Normal rate, regular rhythm. Grossly normal heart sounds.  Good peripheral circulation. Respiratory: Normal respiratory effort.  No retractions. Lungs CTAB. Gastrointestinal: Soft and nontender. No distention. No abdominal bruits. No CVA tenderness. Musculoskeletal: No lower extremity tenderness nor edema.  No joint effusions. Surgical scar is noted  midline lumbar region. No tenderness to palpation midline spinal processes. Diffuse tenderness to palpation over the left paraspinal muscle group. No tenderness to palpation right side paraspinal muscle group. Positive straight leg raise left side. Sensation and pulses are intact distally. Neurologic:  Normal speech and language. No gross focal neurologic deficits are appreciated. No gait instability. Skin:  Skin is warm, dry and intact. No rash noted. Psychiatric: Mood and affect are normal. Speech and behavior are normal.  ____________________________________________   LABS (all labs ordered are listed, but only abnormal results are displayed)  Labs Reviewed - No data to display ____________________________________________  EKG   ____________________________________________  RADIOLOGY   ____________________________________________   PROCEDURES  Procedure(s) performed: None  Critical Care performed: No   Medications  ketorolac (TORADOL) 30 MG/ML injection 60 mg (60 mg Intramuscular Given 04/17/15 1737)    ____________________________________________   INITIAL IMPRESSION / ASSESSMENT AND PLAN / ED COURSE  Pertinent labs & imaging results that were available during my care of the patient were reviewed by me and considered in my medical decision making (see chart for details).  Patient's diagnosis is consistent with lumbago with left-sided sciatica. Patient will be given a shot of Toradol in the emergency department and discharged home with anti-inflammatories and muscle relaxers. Patient is to follow-up with orthopedic provider if symptoms persist past the street and course. Patient verbalizes understanding of the diagnosis and treatment plan and verbalizes compliance with same.   New Prescriptions   DIAZEPAM (VALIUM) 2 MG TABLET    Take 1 tablet (2 mg total) by mouth every 8 (eight) hours as needed for anxiety.   MELOXICAM (MOBIC) 15 MG TABLET    Take 1 tablet (15 mg  total) by mouth daily.    ____________________________________________   FINAL CLINICAL IMPRESSION(S) / ED DIAGNOSES  Final diagnoses:  Acute left-sided low back pain with left-sided sciatica      Racheal Patches, PA-C 04/17/15 1740  Loleta Rose, MD 04/18/15 1610

## 2015-04-17 NOTE — Discharge Instructions (Signed)
Chronic Back Pain ° When back pain lasts longer than 3 months, it is called chronic back pain. People with chronic back pain often go through certain periods that are more intense (flare-ups).  °CAUSES °Chronic back pain can be caused by wear and tear (degeneration) on different structures in your back. These structures include: °· The bones of your spine (vertebrae) and the joints surrounding your spinal cord and nerve roots (facets). °· The strong, fibrous tissues that connect your vertebrae (ligaments). °Degeneration of these structures may result in pressure on your nerves. This can lead to constant pain. °HOME CARE INSTRUCTIONS °· Avoid bending, heavy lifting, prolonged sitting, and activities which make the problem worse. °· Take brief periods of rest throughout the day to reduce your pain. Lying down or standing usually is better than sitting while you are resting. °· Take over-the-counter or prescription medicines only as directed by your caregiver. °SEEK IMMEDIATE MEDICAL CARE IF:  °· You have weakness or numbness in one of your legs or feet. °· You have trouble controlling your bladder or bowels. °· You have nausea, vomiting, abdominal pain, shortness of breath, or fainting. °  °This information is not intended to replace advice given to you by your health care provider. Make sure you discuss any questions you have with your health care provider. °  °Document Released: 05/14/2004 Document Revised: 06/29/2011 Document Reviewed: 09/24/2014 °Elsevier Interactive Patient Education ©2016 [employer] ° °Radicular Pain °Radicular pain in either the arm or leg is usually from a bulging or herniated disk in the spine. A piece of the herniated disk may press against the nerves as the nerves exit the spine. This causes pain which is felt at the tips of the nerves down the arm or leg. Other causes of radicular pain may include: °· Fractures. °· Heart disease. °· Cancer. °· An abnormal and usually degenerative state  of the nervous system or nerves (neuropathy). °Diagnosis may require CT or MRI scanning to determine the primary cause.  °Nerves that start at the neck (nerve roots) may cause radicular pain in the outer shoulder and arm. It can spread down to the thumb and fingers. The symptoms vary depending on which nerve root has been affected. In most cases radicular pain improves with conservative treatment. Neck problems may require physical therapy, a neck collar, or cervical traction. Treatment may take many weeks, and surgery may be considered if the symptoms do not improve.  °Conservative treatment is also recommended for sciatica. Sciatica causes pain to radiate from the lower back or buttock area down the leg into the foot. Often there is a history of back problems. Most patients with sciatica are better after 2 to 4 weeks of rest and other supportive care. Short term bed rest can reduce the disk pressure considerably. Sitting, however, is not a good position since this increases the pressure on the disk. You should avoid bending, lifting, and all other activities which make the problem worse. Traction can be used in severe cases. Surgery is usually reserved for patients who do not improve within the first months of treatment. °Only take over-the-counter or prescription medicines for pain, discomfort, or fever as directed by your caregiver. Narcotics and muscle relaxants may help by relieving more severe pain and spasm and by providing mild sedation. Cold or massage can give significant relief. Spinal manipulation is not recommended. It can increase the degree of disc protrusion. Epidural steroid injections are often effective treatment for radicular pain. These injections deliver medicine to the spinal   nerve in the space between the protective covering of the spinal cord and back bones (vertebrae). Your caregiver can give you more information about steroid injections. These injections are most effective when given  within two weeks of the onset of pain.  °You should see your caregiver for follow up care as recommended. A program for neck and back injury rehabilitation with stretching and strengthening exercises is an important part of management.  °SEEK IMMEDIATE MEDICAL CARE IF: °· You develop increased pain, weakness, or numbness in your arm or leg. °· You develop difficulty with bladder or bowel control. °· You develop abdominal pain. °  °This information is not intended to replace advice given to you by your health care provider. Make sure you discuss any questions you have with your health care provider. °  °Document Released: 05/14/2004 Document Revised: 04/27/2014 Document Reviewed: 10/31/2014 °Elsevier Interactive Patient Education ©2016 Elsevier Inc. ° °

## 2015-05-09 ENCOUNTER — Emergency Department
Admission: EM | Admit: 2015-05-09 | Discharge: 2015-05-09 | Disposition: A | Discharge status: Home / Self Care | Payer: Medicare HMO | Attending: Emergency Medicine | Admitting: Emergency Medicine

## 2015-05-09 ENCOUNTER — Encounter: Payer: Self-pay | Admitting: Emergency Medicine

## 2015-05-09 DIAGNOSIS — F17200 Nicotine dependence, unspecified, uncomplicated: Secondary | ICD-10-CM | POA: Diagnosis not present

## 2015-05-09 DIAGNOSIS — M5441 Lumbago with sciatica, right side: Secondary | ICD-10-CM | POA: Diagnosis not present

## 2015-05-09 DIAGNOSIS — M545 Low back pain: Secondary | ICD-10-CM

## 2015-05-09 DIAGNOSIS — G8929 Other chronic pain: Secondary | ICD-10-CM | POA: Diagnosis not present

## 2015-05-09 MED ORDER — PREDNISONE 10 MG PO TABS: ORAL_TABLET | ORAL | Status: DC

## 2015-05-09 MED ORDER — CYCLOBENZAPRINE HCL 5 MG PO TABS
5.0000 mg | ORAL_TABLET | Freq: Three times a day (TID) | ORAL | Status: DC | PRN
Start: 2015-05-09 — End: 2015-05-29

## 2015-05-09 MED ORDER — KETOROLAC TROMETHAMINE 60 MG/2ML IM SOLN
60.0000 mg | Freq: Once | INTRAMUSCULAR | Status: AC
  Administered 2015-05-09: 60 mg via INTRAMUSCULAR
  Filled 2015-05-09: qty 2

## 2015-05-09 NOTE — ED Notes (Signed)
Pt walked to his car to prevent falling.

## 2015-05-09 NOTE — Discharge Instructions (Signed)
Chronic Back Pain  When back pain lasts longer than 3 months, it is called chronic back pain.People with chronic back pain often go through certain periods that are more intense (flare-ups).  CAUSES Chronic back pain can be caused by wear and tear (degeneration) on different structures in your back. These structures include:  The bones of your spine (vertebrae) and the joints surrounding your spinal cord and nerve roots (facets).  The strong, fibrous tissues that connect your vertebrae (ligaments). Degeneration of these structures may result in pressure on your nerves. This can lead to constant pain. HOME CARE INSTRUCTIONS  Avoid bending, heavy lifting, prolonged sitting, and activities which make the problem worse.  Take brief periods of rest throughout the day to reduce your pain. Lying down or standing usually is better than sitting while you are resting.  Take over-the-counter or prescription medicines only as directed by your caregiver. SEEK IMMEDIATE MEDICAL CARE IF:   You have weakness or numbness in one of your legs or feet.  You have trouble controlling your bladder or bowels.  You have nausea, vomiting, abdominal pain, shortness of breath, or fainting.   This information is not intended to replace advice given to you by your health care provider. Make sure you discuss any questions you have with your health care provider.   Document Released: 05/14/2004 Document Revised: 06/29/2011 Document Reviewed: 09/24/2014 Elsevier Interactive Patient Education [employer]   Please take medications as prescribed. Do not take any ibuprofen, naproxen, or other over the counter NSAIDS while taking prednisone. You may take 500-1000mg  of Tylenol (acetaminophen) every 8 hours as needed while on prednisone.   Be aware that cyclobenzaprine can cause drowsiness and should not be taken prior to driving or operating any heavy machinery.  No alcohol while taking this medication.

## 2015-05-09 NOTE — ED Notes (Signed)
Having lower back pain which is radiating into left leg   Hx of chronic back pain

## 2015-05-09 NOTE — ED Provider Notes (Signed)
CSN: [id number]     Arrival date & time 05/09/15  1349 History   First MD Initiated Contact with Patient 05/09/15 1439     Chief Complaint  Patient presents with  . Back Pain     (Consider location/radiation/quality/duration/timing/severity/associated sxs/prior Treatment) HPI Comments: This is a well-developed well-nourished 65 year old male, NAD. Presents to the emergency department with worsening lower back pain with radiation into the right thigh. Pain is been ongoing for some time. He notes he has chronic lower back pain which was monitored by a pain clinic in [facility] Has not been to the clinic in 6 months as he states he medical provider no longer works at that facility. Previous medications were fentanyl patch and Lyrica. Has been unable to gain primary care or pain management in the last 6 months. No change in his lower back pain and is stable as compared to his chronicity in the past. Denies saddle paresthesias, numbness, weakness or tingling. No new injuries falls or traumas. Denies any dysuria hematuria or changes in urinary or bowel habits. No fevers chills or body aches.  Patient is a 65 y.o. male presenting with back pain. The history is provided by the patient.  Back Pain Location:  Lumbar spine Quality:  Shooting Radiates to:  R posterior upper leg Pain severity:  Severe Pain is:  Same all the time Timing:  Constant Progression:  Worsening Chronicity:  Chronic Context: not falling, not jumping from heights, not lifting heavy objects, not MVA, not occupational injury, not physical stress and not recent injury   Relieved by:  Bed rest Worsened by:  Movement and bending Ineffective treatments:  OTC medications Associated symptoms: leg pain   Associated symptoms: no abdominal pain, no bladder incontinence, no bowel incontinence, no chest pain, no dysuria, no fever, no headaches, no numbness, no paresthesias, no tingling and no weakness     Past Medical History   Diagnosis Date  . Back pain    Past Surgical History  Procedure Laterality Date  . Back surgery     No family history on file. Social History  Substance Use Topics  . Smoking status: Current Every Day Smoker  . Smokeless tobacco: None  . Alcohol Use: No    Review of Systems  Constitutional: Negative for fever, activity change and fatigue.  Respiratory: Negative for chest tightness and shortness of breath.   Cardiovascular: Negative for chest pain.  Gastrointestinal: Negative for abdominal pain and bowel incontinence.  Genitourinary: Negative for bladder incontinence, dysuria, urgency, hematuria and difficulty urinating.  Musculoskeletal: Positive for back pain.  Neurological: Negative for dizziness, tingling, weakness, light-headedness, numbness, headaches and paresthesias.      Allergies  Shellfish allergy  Home Medications   Prior to Admission medications   Medication Sig Start Date End Date Taking? Authorizing Provider  cyclobenzaprine (FLEXERIL) 5 MG tablet Take 1 tablet (5 mg total) by mouth every 8 (eight) hours as needed for muscle spasms. 05/09/15 05/08/16  Mitsy Owen L Henlee Donovan, PA-C  predniSONE (DELTASONE) 10 MG tablet Take a daily regimen of 6,5,4,3,2,1 05/09/15   Maricela Schreur L Laketta Soderberg, PA-C   BP 141/84 mmHg  Pulse 88  Temp(Src) 98 F (36.7 C) (Oral)  Resp 16  Ht  (1.676 m)  Wt 74.844 kg  BMI 26.64 kg/m2  SpO2 96% Physical Exam  Constitutional: He appears well-developed and well-nourished. No distress.  Lying on left side on exam room bed  HENT:  Head: Normocephalic.  Eyes: Conjunctivae are normal. Pupils are equal, round,  and reactive to light.  Neck: Neck supple.  Cardiovascular: Normal rate, regular rhythm and normal heart sounds.  Exam reveals no gallop and no friction rub.   No murmur heard. Pulmonary/Chest: Effort normal and breath sounds normal. No respiratory distress. He has no wheezes. He has no rales.  Musculoskeletal:       Lumbar back: He exhibits  tenderness (diffuse about lumbar spine, greater on the right, patient notes radiattion of pain into right thigh with deep palpation of  right paraspinal surface) and pain. He exhibits no swelling, no edema, no deformity and no spasm. This is a  Skin:  Post surgical scars noted about lumbar spine  Nursing note and vitals reviewed.   ED Course  Procedures (no procedures)   MDM Number of Diagnoses or Management Options Bilateral low back pain with right-sided sciatica:  Chronic lower back pain:    Hope Pigeon, PA-C 05/09/15 1549  Rockne Menghini, MD 05/13/15 1941

## 2015-05-09 NOTE — ED Notes (Signed)
Pt reports that he has had chronic back pain for "a long time" and that he has no pcp. States he can't find a pcp. This nurse advised him that the worst time to look for a pcp is when an immediate appointment is needed, and that he could try calling his insurance company for assistance with finding a pcp. Pt alert & oriented with mild to moderate distress.

## 2015-05-09 NOTE — ED Notes (Signed)
Pt discharged home after verbalizing understanding of discharge instructions; nad noted.  Pt reports no pain relief; given referrals for pain management clinic as well as [facility] to establish care.

## 2015-05-17 ENCOUNTER — Emergency Department
Admission: EM | Admit: 2015-05-17 | Discharge: 2015-05-17 | Disposition: A | Discharge status: Home / Self Care | Payer: Medicare HMO | Attending: Emergency Medicine | Admitting: Emergency Medicine

## 2015-05-17 ENCOUNTER — Encounter: Payer: Self-pay | Admitting: Emergency Medicine

## 2015-05-17 DIAGNOSIS — F17210 Nicotine dependence, cigarettes, uncomplicated: Secondary | ICD-10-CM | POA: Insufficient documentation

## 2015-05-17 DIAGNOSIS — G8929 Other chronic pain: Secondary | ICD-10-CM | POA: Diagnosis not present

## 2015-05-17 DIAGNOSIS — Z7952 Long term (current) use of systemic steroids: Secondary | ICD-10-CM | POA: Insufficient documentation

## 2015-05-17 DIAGNOSIS — M545 Low back pain: Secondary | ICD-10-CM | POA: Diagnosis present

## 2015-05-17 DIAGNOSIS — M5442 Lumbago with sciatica, left side: Secondary | ICD-10-CM | POA: Insufficient documentation

## 2015-05-17 DIAGNOSIS — R55 Syncope and collapse: Secondary | ICD-10-CM | POA: Diagnosis not present

## 2015-05-17 HISTORY — DX: Other chronic pain: G89.29

## 2015-05-17 HISTORY — DX: Dorsalgia, unspecified: M54.9

## 2015-05-17 MED ORDER — KETOROLAC TROMETHAMINE 60 MG/2ML IM SOLN
60.0000 mg | Freq: Once | INTRAMUSCULAR | Status: AC
  Administered 2015-05-17: 60 mg via INTRAMUSCULAR
  Filled 2015-05-17: qty 2

## 2015-05-17 MED ORDER — BACLOFEN 10 MG PO TABS: 5.0000 mg | ORAL_TABLET | Freq: Three times a day (TID) | ORAL | Status: DC

## 2015-05-17 NOTE — ED Notes (Signed)
Patient presents to the ED with lower back pain/spasms.  Patient states he has had chronic back pain for years caused by an injury at work but states today's spasm's are difficulty for him to deal with, they woke him up from his sleep this morning.  Patient denies any recent falls or back injuries.  Patient denies dysuria.

## 2015-05-17 NOTE — Discharge Instructions (Signed)
You need to go to the pain clinic in [place] or reestablish herself at the pain clinic in [place].

## 2015-05-17 NOTE — ED Provider Notes (Signed)
[facility] Emergency Department Provider Note  ____________________________________________  Time seen: Approximately 12:43 PM  I have reviewed the triage vital signs and the nursing notes.   HISTORY  Chief Complaint Back Pain    HPI Jason Moss is a 65 y.o. male is here complaint of low back pain that radiates into his right leg. Patient states that he has chronic low back pain and was being seen in the pain clinic in [place] where he was getting fentanyl patches and Lyrica. He denies any new injury, no urinary symptoms or hematuria. There is been no fever, chills, nausea or vomiting. There is been no incontinence of bowel or bladder.Patient was seen in the emergency room a 05/09/15 for the same complaint. At that time he complained of back pain with radiation into his right thigh. Today he complains of low back pain with radiation into his left leg. Patient states that he did take all the medication and is now out but he doesn't remember what day he completed the steroid taper. He is unable to qualify if he got relief from this medication or not. He also has not called anyone to make an appointment since he was seen on the 19th. Currently he rates his pain as 10 out of 10.   Past Medical History  Diagnosis Date  . Back pain   . Chronic back pain     There are no active problems to display for this patient.   Past Surgical History  Procedure Laterality Date  . Back surgery      Current Outpatient Rx  Name  Route  Sig  Dispense  Refill  . baclofen (LIORESAL) 10 MG tablet   Oral   Take 0.5 tablets (5 mg total) by mouth 3 (three) times daily.   9 each   0   . cyclobenzaprine (FLEXERIL) 5 MG tablet   Oral   Take 1 tablet (5 mg total) by mouth every 8 (eight) hours as needed for muscle spasms.   30 tablet   0   . predniSONE (DELTASONE) 10 MG tablet      Take a daily regimen of 6,5,4,3,2,1   21 tablet   0     Allergies Shellfish  allergy  No family history on file.  Social History Social History  Substance Use Topics  . Smoking status: Current Every Day Smoker -- 0.50 packs/day    Types: Cigarettes  . Smokeless tobacco: None  . Alcohol Use: No    Review of Systems Constitutional: No fever/chills Eyes: No visual changes. ENT: No sore throat. Cardiovascular: Denies chest pain. Respiratory: Denies shortness of breath. Gastrointestinal: No abdominal pain.  No nausea, no vomiting.  No diarrhea.  No constipation. Genitourinary: Negative for dysuria. Musculoskeletal: Positive chronic back pain. Skin: Negative for rash. Neurological: Negative for headaches, focal weakness or numbness. Paresthesias into his left leg.  10-point ROS otherwise negative.  ____________________________________________   PHYSICAL EXAM:  VITAL SIGNS: ED Triage Vitals  Enc Vitals Group     BP 05/17/15 1228 136/85 mmHg     Pulse Rate 05/17/15 1228 90     Resp 05/17/15 1228 20     Temp 05/17/15 1228 98.7 F (37.1 C)     Temp Source 05/17/15 1228 Oral     SpO2 05/17/15 1228 99 %     Weight 05/17/15 1228 165 lb (74.844 kg)     Height 05/17/15 1228  (1.676 m)     Head Cir --  Peak Flow --      Pain Score 05/17/15 1229 10     Pain Loc --      Pain Edu? --      Excl. in GC? --     Constitutional: Alert and oriented. Well appearing and in no acute distress. Eyes: Conjunctivae are normal. PERRL. EOMI. Head: Atraumatic. Nose: No congestion/rhinnorhea. Neck: No stridor.   Cardiovascular: Normal rate, regular rhythm. Grossly normal heart sounds.  Good peripheral circulation. Respiratory: Normal respiratory effort.  No retractions. Lungs CTAB. Gastrointestinal: Soft and nontender. No distention. BS normoactive 4 quadrants. Musculoskeletal: Moves upper and lower extremities without any difficulty. On examination of the back there is no gross deformity however there is some tenderness on palpation diffusely throughout the  lower lumbar and paravertebral muscle region. There is no active muscle spasm seen at this time. Patient does have difficulty in getting from a supine to standing position. Patient is walking today with a cane. Neurologic:  Normal speech and language. No gross focal neurologic deficits are appreciated. No gait instability. Skin:  Skin is warm, dry and intact. No rash noted. Psychiatric: Mood and affect are normal. Speech and behavior are normal.  ____________________________________________   LABS (all labs ordered are listed, but only abnormal results are displayed)  Labs Reviewed - No data to display  PROCEDURES  Procedure(s) performed: None  Critical Care performed: No  ____________________________________________   INITIAL IMPRESSION / ASSESSMENT AND PLAN / ED COURSE  Pertinent labs & imaging results that were available during my care of the patient were reviewed by me and considered in my medical decision making (see chart for details).  Patient was given injection of Toradol 60 mg IM while in the emergency room and a prescription for baclofen one half tablet 3 times a day as needed for muscle spasms. He is encouraged to either go to his pain clinic in [place] or set up an appointment with the pain clinic here in [place]. ____________________________________________   FINAL CLINICAL IMPRESSION(S) / ED DIAGNOSES  Final diagnoses:  Near syncope  Chronic bilateral low back pain with left-sided sciatica      Tommi Rumps, PA-C 05/17/15 1516  Jennye Moccasin, MD 05/17/15 1544

## 2015-05-17 NOTE — ED Notes (Signed)
States he has lower back pain which radiates into leg   Denies any recent injury   Increased pain with standing and ambulation.

## 2015-05-29 ENCOUNTER — Emergency Department
Admission: EM | Admit: 2015-05-29 | Discharge: 2015-05-29 | Disposition: A | Discharge status: Home / Self Care | Payer: Medicare HMO | Attending: Emergency Medicine | Admitting: Emergency Medicine

## 2015-05-29 ENCOUNTER — Encounter: Payer: Self-pay | Admitting: *Deleted

## 2015-05-29 DIAGNOSIS — G8929 Other chronic pain: Secondary | ICD-10-CM | POA: Diagnosis not present

## 2015-05-29 DIAGNOSIS — F17210 Nicotine dependence, cigarettes, uncomplicated: Secondary | ICD-10-CM | POA: Diagnosis not present

## 2015-05-29 DIAGNOSIS — M545 Low back pain, unspecified: Secondary | ICD-10-CM

## 2015-05-29 DIAGNOSIS — Z9889 Other specified postprocedural states: Secondary | ICD-10-CM | POA: Insufficient documentation

## 2015-05-29 MED ORDER — DIAZEPAM 5 MG PO TABS: 5.0000 mg | ORAL_TABLET | Freq: Three times a day (TID) | ORAL | Status: DC | PRN

## 2015-05-29 MED ORDER — OXYCODONE-ACETAMINOPHEN 5-325 MG PO TABS: 1.0000 | ORAL_TABLET | ORAL | Status: DC | PRN

## 2015-05-29 MED ORDER — GABAPENTIN 100 MG PO CAPS: 100.0000 mg | ORAL_CAPSULE | Freq: Three times a day (TID) | ORAL | Status: DC

## 2015-05-29 NOTE — ED Notes (Signed)
States he is having lower back pain which is chronic.Marland Kitchendeneis recent injury. And states he is not able to get a MD to see him

## 2015-05-29 NOTE — ED Provider Notes (Signed)
[facility] Emergency Department Provider Note  ____________________________________________  Time seen: Approximately 3:45 PM  I have reviewed the triage vital signs and the nursing notes.   HISTORY  Chief Complaint Back Pain    HPI Jason Moss is a 65 y.o. male presents for recurrent and ongoing chronic low back pain. Patient reports having back surgery and lumbar spine in 2013. Needs a referral to pain management and neurosurgery. Patient voices no other complaints at this time.   Past Medical History  Diagnosis Date  . Back pain   . Chronic back pain     There are no active problems to display for this patient.   Past Surgical History  Procedure Laterality Date  . Back surgery      Current Outpatient Rx  Name  Route  Sig  Dispense  Refill  . diazepam (VALIUM) 5 MG tablet   Oral   Take 1 tablet (5 mg total) by mouth every 8 (eight) hours as needed for anxiety.   30 tablet   0   . gabapentin (NEURONTIN) 100 MG capsule   Oral   Take 1 capsule (100 mg total) by mouth 3 (three) times daily. Increase by 1 pill every 3 days. Maximum is 900 mg 3x daily   90 capsule   0   . oxyCODONE-acetaminophen (ROXICET) 5-325 MG tablet   Oral   Take 1-2 tablets by mouth every 4 (four) hours as needed for severe pain.   15 tablet   0     Allergies Shellfish allergy  History reviewed. No pertinent family history.  Social History Social History  Substance Use Topics  . Smoking status: Current Every Day Smoker -- 0.50 packs/day    Types: Cigarettes  . Smokeless tobacco: None  . Alcohol Use: No    Review of Systems Cardiovascular: Denies chest pain. Respiratory: Denies shortness of breath. Genitourinary: Negative for dysuria. Musculoskeletal: Positive for back pain. Skin: Negative for rash. Neurological: Negative for headaches, focal weakness or numbness.  10-point ROS otherwise  negative.  ____________________________________________   PHYSICAL EXAM:  VITAL SIGNS: ED Triage Vitals  Enc Vitals Group     BP 05/29/15 1526 153/91 mmHg     Pulse Rate 05/29/15 1526 80     Resp 05/29/15 1526 18     Temp 05/29/15 1526 98.4 F (36.9 C)     Temp Source 05/29/15 1526 Oral     SpO2 05/29/15 1526 98 %     Weight 05/29/15 1526 160 lb (72.576 kg)     Height 05/29/15 1526  (1.676 m)     Head Cir --      Peak Flow --      Pain Score 05/29/15 1526 9     Pain Loc --      Pain Edu? --      Excl. in GC? --     Constitutional: Alert and oriented. Well appearing and in no acute distress. Cardiovascular: Normal rate, regular rhythm. Grossly normal heart sounds.  Good peripheral circulation. Respiratory: Normal respiratory effort.  No retractions. Lungs CTAB. Gastrointestinal: Soft and nontender. No distention. No abdominal bruits. No CVA tenderness. Musculoskeletal: Low back pain with point tenderness. Postoperative scarring noted to the lower back. Straight leg raise positive bilaterally. Sling neurovascularly intact. Neurologic:  Normal speech and language. No gross focal neurologic deficits are appreciated. No gait instability. Skin:  Skin is warm, dry and intact. No rash noted. Psychiatric: Mood and affect are normal. Speech and behavior  are normal.  ____________________________________________   LABS (all labs ordered are listed, but only abnormal results are displayed)  Labs Reviewed - No data to display ____________________________________________   PROCEDURES  Procedure(s) performed: None  Critical Care performed: No  ____________________________________________   INITIAL IMPRESSION / ASSESSMENT AND PLAN / ED COURSE  Pertinent labs & imaging results that were available during my care of the patient were reviewed by me and considered in my medical decision making (see chart for details).  Recurrent ongoing lower back pain. From given to  neurosurgery [facility] Dr. Jordan Likes, and Dr. Metta Clines for pain management of [place]. Rx given for pen, Percocet. Patient voices no other emergency medical complaints at this time will follow-up with PCP or return to the ER as needed. ____________________________________________   FINAL CLINICAL IMPRESSION(S) / ED DIAGNOSES  Final diagnoses:  Multifactorial low back pain     This chart was dictated using voice recognition software/Dragon. Despite best efforts to proofread, errors can occur which can change the meaning. Any change was purely unintentional.   Evangeline Dakin, PA-C 05/29/15 1707  Phineas Semen, MD 05/29/15 [phone]

## 2015-05-29 NOTE — ED Notes (Signed)
States lower back pain, chronic hx

## 2015-05-29 NOTE — Discharge Instructions (Signed)

## 2015-07-15 ENCOUNTER — Emergency Department
Admission: EM | Admit: 2015-07-15 | Discharge: 2015-07-15 | Disposition: A | Discharge status: Home / Self Care | Payer: Medicare HMO | Attending: Student | Admitting: Student

## 2015-07-15 ENCOUNTER — Encounter: Payer: Self-pay | Admitting: Emergency Medicine

## 2015-07-15 DIAGNOSIS — G8929 Other chronic pain: Secondary | ICD-10-CM | POA: Insufficient documentation

## 2015-07-15 DIAGNOSIS — M545 Low back pain: Secondary | ICD-10-CM | POA: Insufficient documentation

## 2015-07-15 DIAGNOSIS — F17210 Nicotine dependence, cigarettes, uncomplicated: Secondary | ICD-10-CM | POA: Insufficient documentation

## 2015-07-15 MED ORDER — DICLOFENAC SODIUM 75 MG PO TBEC: 75.0000 mg | DELAYED_RELEASE_TABLET | Freq: Two times a day (BID) | ORAL | Status: AC

## 2015-07-15 MED ORDER — CYCLOBENZAPRINE HCL 10 MG PO TABS: 10.0000 mg | ORAL_TABLET | Freq: Three times a day (TID) | ORAL | Status: DC | PRN

## 2015-07-15 MED ORDER — OXYCODONE-ACETAMINOPHEN 5-325 MG PO TABS: 1.0000 | ORAL_TABLET | ORAL | Status: AC | PRN

## 2015-07-15 NOTE — ED Provider Notes (Signed)
[facility] Emergency Department Provider Note  ____________________________________________  Time seen: Approximately 2:37 PM  I have reviewed the triage vital signs and the nursing notes.   HISTORY  Chief Complaint Back Pain    HPI Jason Moss is a 65 y.o. male presents for evaluation of acute exacerbation of chronic low back pain. Patient states that his pain is increased over the past 3 years has had pain on and off 2008 according to the previous medical records has had a history of some bulging disks and nerve impingement in the past. Patient reports pain has been severe over the past 3 days.   Past Medical History  Diagnosis Date  . Back pain   . Chronic back pain     There are no active problems to display for this patient.   Past Surgical History  Procedure Laterality Date  . Back surgery      Current Outpatient Rx  Name  Route  Sig  Dispense  Refill  . diazepam (VALIUM) 5 MG tablet   Oral   Take 1 tablet (5 mg total) by mouth every 8 (eight) hours as needed for anxiety.   30 tablet   0   . gabapentin (NEURONTIN) 100 MG capsule   Oral   Take 1 capsule (100 mg total) by mouth 3 (three) times daily. Increase by 1 pill every 3 days. Maximum is 900 mg 3x daily   90 capsule   0   . oxyCODONE-acetaminophen (ROXICET) 5-325 MG tablet   Oral   Take 1-2 tablets by mouth every 4 (four) hours as needed for severe pain.   15 tablet   0     Allergies Shellfish allergy  No family history on file.  Social History Social History  Substance Use Topics  . Smoking status: Current Every Day Smoker -- 0.50 packs/day    Types: Cigarettes  . Smokeless tobacco: None  . Alcohol Use: No    Review of Systems Constitutional: No fever/chills Cardiovascular: Denies chest pain. Respiratory: Denies shortness of breath. Gastrointestinal: No abdominal pain.  No nausea, no vomiting.  No diarrhea.  No constipation. Genitourinary: Negative for  dysuria. Musculoskeletal: Positive for low back pain Neurological: Negative for headaches, focal weakness or numbness.  10-point ROS otherwise negative.  ____________________________________________   PHYSICAL EXAM:  VITAL SIGNS: ED Triage Vitals  Enc Vitals Group     BP 07/15/15 1304 137/83 mmHg     Pulse Rate 07/15/15 1304 75     Resp 07/15/15 1304 18     Temp 07/15/15 1304 98 F (36.7 C)     Temp Source 07/15/15 1304 Oral     SpO2 07/15/15 1304 100 %     Weight 07/15/15 1304 166 lb (75.297 kg)     Height 07/15/15 1304 5\' 7"  (1.702 m)     Head Cir --      Peak Flow --      Pain Score 07/15/15 1305 10     Pain Loc --      Pain Edu? --      Excl. in GC? --     Constitutional: Alert and oriented. Well appearing and in no acute distress.  Neck: No stridor.   Cardiovascular: Normal rate, regular rhythm. Grossly normal heart sounds.  Good peripheral circulation. Respiratory: Normal respiratory effort.  No retractions. Lungs CTAB. Gastrointestinal: Soft and nontender. No distention. No abdominal bruits. No CVA tenderness. Musculoskeletal: Point tenderness lumbar spine straight leg raise positive bilaterally at approximately  15-20. Distally neurovascularly intact. Neurologic:  Normal speech and language. No gross focal neurologic deficits are appreciated. No gait instability. Skin:  Skin is warm, dry and intact. No rash noted. Psychiatric: Mood and affect are normal. Speech and behavior are normal.  ____________________________________________   LABS (all labs ordered are listed, but only abnormal results are displayed)  Labs Reviewed - No data to display   PROCEDURES  Procedure(s) performed: None  Critical Care performed: No  ____________________________________________   INITIAL IMPRESSION / ASSESSMENT AND PLAN / ED COURSE  Pertinent labs & imaging results that were available during my care of the patient were reviewed by me and considered in my medical  decision making (see chart for details).  Acute exacerbation of low back pain. Rx given for Flexeril 10 mg 3 times a day, gabapentin tapering increasing dose and Percocet 5/325. Patient follow-up PCP for further evaluation and continued care. ____________________________________________   FINAL CLINICAL IMPRESSION(S) / ED DIAGNOSES  Final diagnoses:  Low back pain without sciatica, unspecified back pain laterality     This chart was dictated using voice recognition software/Dragon. Despite best efforts to proofread, errors can occur which can change the meaning. Any change was purely unintentional.   Evangeline Dakin, PA-C 07/15/15 1454  Gayla Doss, MD 07/15/15 [phone]

## 2015-07-15 NOTE — Discharge Instructions (Signed)

## 2015-07-15 NOTE — ED Notes (Signed)
See triage. States he has had chronic back issues and has been seen for same. Unable to see f/u md d/t transportation problems.  Pain has increased over the past 3 days .

## 2015-07-15 NOTE — ED Notes (Signed)
Pt presents to the ED with low back. Pt states has been going on for over one year. Pt states the pain has increased recently and has affected his mobility.

## 2015-08-24 ENCOUNTER — Emergency Department
Admission: EM | Admit: 2015-08-24 | Discharge: 2015-08-24 | Disposition: A | Discharge status: Home / Self Care | Payer: Medicare HMO | Attending: Emergency Medicine | Admitting: Emergency Medicine

## 2015-08-24 ENCOUNTER — Encounter: Payer: Self-pay | Admitting: Emergency Medicine

## 2015-08-24 DIAGNOSIS — Z791 Long term (current) use of non-steroidal anti-inflammatories (NSAID): Secondary | ICD-10-CM | POA: Diagnosis not present

## 2015-08-24 DIAGNOSIS — G8929 Other chronic pain: Secondary | ICD-10-CM | POA: Insufficient documentation

## 2015-08-24 DIAGNOSIS — M5416 Radiculopathy, lumbar region: Secondary | ICD-10-CM | POA: Diagnosis not present

## 2015-08-24 DIAGNOSIS — M549 Dorsalgia, unspecified: Secondary | ICD-10-CM | POA: Diagnosis present

## 2015-08-24 DIAGNOSIS — F17210 Nicotine dependence, cigarettes, uncomplicated: Secondary | ICD-10-CM | POA: Insufficient documentation

## 2015-08-24 DIAGNOSIS — M545 Low back pain: Secondary | ICD-10-CM

## 2015-08-24 MED ORDER — OXYCODONE-ACETAMINOPHEN 5-325 MG PO TABS
1.0000 | ORAL_TABLET | Freq: Once | ORAL | Status: AC
  Administered 2015-08-24: 1 via ORAL

## 2015-08-24 MED ORDER — CYCLOBENZAPRINE HCL 10 MG PO TABS
ORAL_TABLET | ORAL | Status: AC
  Administered 2015-08-24: 5 mg via ORAL
  Filled 2015-08-24: qty 1

## 2015-08-24 MED ORDER — NAPROXEN 500 MG PO TBEC: 500.0000 mg | DELAYED_RELEASE_TABLET | Freq: Two times a day (BID) | ORAL | Status: AC

## 2015-08-24 MED ORDER — CYCLOBENZAPRINE HCL 5 MG PO TABS: 5.0000 mg | ORAL_TABLET | Freq: Three times a day (TID) | ORAL | Status: AC | PRN

## 2015-08-24 MED ORDER — GABAPENTIN 300 MG PO CAPS: 600.0000 mg | ORAL_CAPSULE | Freq: Three times a day (TID) | ORAL | Status: AC

## 2015-08-24 MED ORDER — OXYCODONE-ACETAMINOPHEN 5-325 MG PO TABS
ORAL_TABLET | ORAL | Status: AC
  Administered 2015-08-24: 1 via ORAL
  Filled 2015-08-24: qty 1

## 2015-08-24 MED ORDER — CYCLOBENZAPRINE HCL 10 MG PO TABS
5.0000 mg | ORAL_TABLET | Freq: Once | ORAL | Status: AC
  Administered 2015-08-24: 5 mg via ORAL

## 2015-08-24 NOTE — ED Notes (Signed)
Pt c/o back pain for 4 years, sts he has never been seen by PCP for it only [facility] ED.  Pt has 2 pill bottles, one Roxicet which he sts work and other toradol which pt sts does not work.  Direct pt to poster regarding chronic pain.  NAD.

## 2015-08-24 NOTE — ED Notes (Signed)
Pt states his low back pain is chronic and has been going on for over a years. States back pain is worse today and thinks the pain medication he is taking is a placebo.

## 2015-08-24 NOTE — ED Provider Notes (Signed)
[facility] Emergency Department Provider Note ____________________________________________  Time seen: 1121  I have reviewed the triage vital signs and the nursing notes.  HISTORY  Chief Complaint  Back Pain  HPI Jason Moss is a 65 y.o. male is a patient well-known to this ED for multiple visits related to his chronic back pain. He presents again today with back pain that he describes as worse today. He denies any injury, accident, trauma, slip, trip, fall. He also denies any bladder or bowel incontinence. He denies any foot drop, leg weakness, or distal paresthesias above his baseline. He describes the pain as a "electrical shock down my left leg." He presents to the medicine bottles when asked about his current medications. The bottle for the oxycodone is empty, and he notes that this is the one pain medicine that does help. The other bottle is for a Etodolac SR, which he claims he takes daily without any benefit. He remarks that he thinks that particular medicine is a placebo.He claims to not have a current primary care provider. And states that he is in the process of establishing care with a local pain provider near his home. He has not sought follow-up since his last visit here on 3/27. He rates his pain a 9/10 in triage.  Past Medical History  Diagnosis Date  . Back pain   . Chronic back pain     There are no active problems to display for this patient.   Past Surgical History  Procedure Laterality Date  . Back surgery      Current Outpatient Rx  Name  Route  Sig  Dispense  Refill  . cyclobenzaprine (FLEXERIL) 5 MG tablet   Oral   Take 1 tablet (5 mg total) by mouth every 8 (eight) hours as needed for muscle spasms.   12 tablet   0   . diclofenac (VOLTAREN) 75 MG EC tablet   Oral   Take 1 tablet (75 mg total) by mouth 2 (two) times daily.   60 tablet   0   . gabapentin (NEURONTIN) 300 MG capsule   Oral   Take 2 capsules (600 mg total) by  mouth 3 (three) times daily.   90 capsule   1   . naproxen (EC NAPROSYN) 500 MG EC tablet   Oral   Take 1 tablet (500 mg total) by mouth 2 (two) times daily with a meal.   30 tablet   0   . oxyCODONE-acetaminophen (ROXICET) 5-325 MG tablet   Oral   Take 1-2 tablets by mouth every 4 (four) hours as needed for severe pain.   15 tablet   0     Allergies Shellfish allergy  History reviewed. No pertinent family history.  Social History Social History  Substance Use Topics  . Smoking status: Current Every Day Smoker -- 0.50 packs/day    Types: Cigarettes  . Smokeless tobacco: None  . Alcohol Use: No    Review of Systems  Constitutional: Negative for fever. Cardiovascular: Negative for chest pain. Respiratory: Negative for shortness of breath. Gastrointestinal: Negative for abdominal pain, vomiting and diarrhea. Genitourinary: Negative for incontinence or retention. Musculoskeletal: Positive for back pain. Skin: Negative for rash. Neurological: Negative for headaches, focal weakness or numbness. Reports LLE paresthesias.  ____________________________________________  PHYSICAL EXAM:  VITAL SIGNS: ED Triage Vitals  Enc Vitals Group     BP 08/24/15 1058 131/86 mmHg     Pulse Rate 08/24/15 1058 98     Resp  08/24/15 1058 18     Temp 08/24/15 1058 99.5 F (37.5 C)     Temp src --      SpO2 08/24/15 1058 98 %     Weight 08/24/15 1058 160 lb (72.576 kg)     Height 08/24/15 1058  (1.676 m)     Head Cir --      Peak Flow --      Pain Score 08/24/15 1100 9     Pain Loc --      Pain Edu? --      Excl. in GC? --    Constitutional: Alert and oriented. Well appearing and in no distress. Head: Normocephalic and atraumatic. Cardiovascular: Normal rate, regular rhythm.  Respiratory: Normal respiratory effort. No wheezes/rales/rhonchi. Gastrointestinal: Soft and nontender. No distention, rebound, guarding, or organomegaly. Normal bowel sounds. Musculoskeletal: Normal  spinal alignment without midline deformity, step-off. Nontender with normal range of motion in all extremities. Negative supine/seated SLR bilaterally.  Neurologic: CN II_XII grossly intact. Normal LE DTRs bilaterally. Normal gait without ataxia. Normal speech and language. No gross focal neurologic deficits are appreciated. Skin:  Skin is warm, dry and intact. No rash noted. ____________________________________________  PROCEDURES  Roxicet 5-325 mg PO Flexeril 5 mg PO ____________________________________________  INITIAL IMPRESSION / ASSESSMENT AND PLAN / ED COURSE  Patient with chronic lower back pain with radicular symptoms in the left lower extremity. No indication or presentation today of any acute changes. He will be discharged with prescriptions for gabapentin, Flexeril, and EC Naprosyn to dose as directed. He will discontinue his previously prescribed etodolac. He will follow up with either Dr. Maryellen Pile, Dr. Hyacinth Meeker, or request a referral to the [facility]. Patient is advised about the hospitals chronic pain management policy and noted that he would not receive narcotic medications on this particular visit. He should return for any acute lotion weakness, bladder bowel incontinence, or foot drop. ____________________________________________  FINAL CLINICAL IMPRESSION(S) / ED DIAGNOSES  Final diagnoses:  Chronic lower back pain  Chronic radicular pain of lower back      Lissa Hoard, PA-C 08/24/15 1203  Sharman Cheek, MD 08/25/15 2122

## 2015-08-24 NOTE — Discharge Instructions (Signed)
Chronic Back Pain  When back pain lasts longer than 3 months, it is called chronic back pain.People with chronic back pain often go through certain periods that are more intense (flare-ups).  CAUSES Chronic back pain can be caused by wear and tear (degeneration) on different structures in your back. These structures include:  The bones of your spine (vertebrae) and the joints surrounding your spinal cord and nerve roots (facets).  The strong, fibrous tissues that connect your vertebrae (ligaments). Degeneration of these structures may result in pressure on your nerves. This can lead to constant pain. HOME CARE INSTRUCTIONS  Avoid bending, heavy lifting, prolonged sitting, and activities which make the problem worse.  Take brief periods of rest throughout the day to reduce your pain. Lying down or standing usually is better than sitting while you are resting.  Take over-the-counter or prescription medicines only as directed by your caregiver. SEEK IMMEDIATE MEDICAL CARE IF:   You have weakness or numbness in one of your legs or feet.  You have trouble controlling your bladder or bowels.  You have nausea, vomiting, abdominal pain, shortness of breath, or fainting.   This information is not intended to replace advice given to you by your health care provider. Make sure you discuss any questions you have with your health care provider.   Document Released: 05/14/2004 Document Revised: 06/29/2011 Document Reviewed: 09/24/2014 Else[employer] Interactive Patient Education 2016 [employer]  Radicular Pain Radicular pain in either the arm or leg is usually from a bulging or herniated disk in the spine. A piece of the herniated disk may press against the nerves as the nerves exit the spine. This causes pain which is felt at the tips of the nerves down the arm or leg. Other causes of radicular pain may include:  Fractures.  Heart disease.  Cancer.  An abnormal and usually degenerative state  of the nervous system or nerves (neuropathy). Diagnosis may require CT or MRI scanning to determine the primary cause.  Nerves that start at the neck (nerve roots) may cause radicular pain in the outer shoulder and arm. It can spread down to the thumb and fingers. The symptoms vary depending on which nerve root has been affected. In most cases radicular pain improves with conservative treatment. Neck problems may require physical therapy, a neck collar, or cervical traction. Treatment may take many weeks, and surgery may be considered if the symptoms do not improve.  Conservative treatment is also recommended for sciatica. Sciatica causes pain to radiate from the lower back or buttock area down the leg into the foot. Often there is a history of back problems. Most patients with sciatica are better after 2 to 4 weeks of rest and other supportive care. Short term bed rest can reduce the disk pressure considerably. Sitting, however, is not a good position since this increases the pressure on the disk. You should avoid bending, lifting, and all other activities which make the problem worse. Traction can be used in severe cases. Surgery is usually reserved for patients who do not improve within the first months of treatment. Only take over-the-counter or prescription medicines for pain, discomfort, or fever as directed by your caregiver. Narcotics and muscle relaxants may help by relieving more severe pain and spasm and by providing mild sedation. Cold or massage can give significant relief. Spinal manipulation is not recommended. It can increase the degree of disc protrusion. Epidural steroid injections are often effective treatment for radicular pain. These injections deliver medicine to the spinal  nerve in the space between the protective covering of the spinal cord and back bones (vertebrae). Your caregiver can give you more information about steroid injections. These injections are most effective when given  within two weeks of the onset of pain.  You should see your caregiver for follow up care as recommended. A program for neck and back injury rehabilitation with stretching and strengthening exercises is an important part of management.  SEEK IMMEDIATE MEDICAL CARE IF:  You develop increased pain, weakness, or numbness in your arm or leg.  You develop difficulty with bladder or bowel control.  You develop abdominal pain.   This information is not intended to replace advice given to you by your health care provider. Make sure you discuss any questions you have with your health care provider.   Document Released: 05/14/2004 Document Revised: 04/27/2014 Document Reviewed: 10/31/2014 [employer] Interactive Patient Education [employer]  Emergency care providers appreciate that many patients coming to [place] are in severe pain and we wish to address their pain in the safest, most responsible manner.  It is important to recognize, however, that the proper treatment of chronic pain differs from that of the pain of injuries and acute illnesses.  Our goal is to provider quality, safe, personalized care and we thank you for giving [place] the opportunity to serve you.  The use of narcotics and related agents for chronic pain syndromes may lead to additional physical and psychological problems.  Nearly as many people die from prescription narcotics each year as die from car crashes.  Additionally, this risk is increased if such prescriptions are obtained from a variety of sources.  Therefore, only your primary care physician or a pain management specialist is able to safely treat such syndromes with narcotic medications long-term.  Documentation revealing such prescriptions have been sought from multiple sources may prohibit [place] from providing a refill or different narcotic medication.  Your name may be checked first through the [facility] Controlled Substances Reporting System.  This database is a record of  controlled substance medication prescriptions that the patient has received.  This has been established by [facility] in an effort to eliminate the dangerous, and often life-threatening, practice of obtaining multiple prescriptions from different medical providers.  If you have a chronic pain syndrome (i.e. chronic headaches, recurrent back or neck pain, dental pain, abdominal or pelvic pain without a specific diagnosis, or neuropathic pain such as fibromyalgia) or recurrent visits for the same condition without an acute diagnosis, you may be treated with non-narcotics and other non-addictive medicines.  Allergic reactions or negative side effects that may be reported by a patient to such medications will not typically lead to the use of a narcotic analgesic or other controlled substance as an alternative.  Patients managing chronic pain with a personal physician should have provisions in place for breakthrough pain.  If you are in crisis, you should call your physician.  If your physician directs you to the emergency department, please have the doctor call and speak to our attending physician concerning your care.  When patients come to the Emergency Department (ED) with acute medical conditions in which the ED physician feels it is appropriate to prescribe narcotic or sedating pain medication, the physician will prescribe these in very limited quantities.  The amount of these medications will last only until you can see your primary care physician in his/her office.  Any patient who returns to the ED seeking refills should expect only non-narcotic pain medications.  In the event an acute medical condition exists and the emergency physician feels it is necessary that the patient be given a narcotic or sedating medication, a responsible adult driver should be present in the room prior to the medication being given by the nurse.  Prescriptions for narcotic or sedating medications that have been lost,  stolen, or expired will NOT be refilled in the ED.  Patients who have chronic pain may receive non-narcotic prescriptions until seen by their primary care physician.  It is every patient's personal responsibility to maintain active prescriptions with his or her primary care physician or specialist.  ===============================================================================================================  You should take the prescription meds as directed. You must follow-up with your providers (Dr. Maryellen PileEason or Dr. Hyacinth MeekerMiller) for a referral to pain management. You may not receive narcotic pain medicines from the emergency department for your chronic pain. You must be treated by a local medical provider. You may dose the new prescription Naprosyn in place of your Etodolac for pain and inflammation.

## 2015-10-19 DEATH — deceased
